# Patient Record
Sex: Female | Born: 1980
Health system: Southern US, Community
[De-identification: ages and names within clinical notes are randomized; demographics above are authoritative.]

## PROBLEM LIST (undated history)

## (undated) DIAGNOSIS — C801 Malignant (primary) neoplasm, unspecified: Secondary | ICD-10-CM

## (undated) DIAGNOSIS — F319 Bipolar disorder, unspecified: Secondary | ICD-10-CM

## (undated) DIAGNOSIS — F419 Anxiety disorder, unspecified: Secondary | ICD-10-CM

## (undated) DIAGNOSIS — F32A Depression, unspecified: Secondary | ICD-10-CM

## (undated) DIAGNOSIS — E559 Vitamin D deficiency, unspecified: Secondary | ICD-10-CM

## (undated) DIAGNOSIS — F329 Major depressive disorder, single episode, unspecified: Secondary | ICD-10-CM

## (undated) DIAGNOSIS — E162 Hypoglycemia, unspecified: Secondary | ICD-10-CM

## (undated) DIAGNOSIS — G8929 Other chronic pain: Secondary | ICD-10-CM

## (undated) DIAGNOSIS — R7989 Other specified abnormal findings of blood chemistry: Secondary | ICD-10-CM

## (undated) DIAGNOSIS — D649 Anemia, unspecified: Secondary | ICD-10-CM

## (undated) HISTORY — DX: Vitamin D deficiency, unspecified: E55.9

## (undated) HISTORY — DX: Headache, unspecified: G89.29

## (undated) HISTORY — DX: Hypoglycemia, unspecified: E16.2

## (undated) HISTORY — DX: Anxiety disorder, unspecified: F41.9

## (undated) HISTORY — PX: TUBAL LIGATION: SHX77

## (undated) HISTORY — DX: Depression, unspecified: F32.A

## (undated) HISTORY — DX: Bipolar disorder, unspecified: F31.9

## (undated) HISTORY — PX: CERVICAL CONE BIOPSY: SUR198

## (undated) HISTORY — PX: DILATION AND CURETTAGE OF UTERUS: SHX78

## (undated) HISTORY — DX: Other specified abnormal findings of blood chemistry: R79.89

---
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
## 2009-01-25 ENCOUNTER — Inpatient Hospital Stay (HOSPITAL_COMMUNITY): Admission: AD | Admit: 2009-01-25 | Discharge: 2009-01-25 | Payer: Self-pay | Admitting: Obstetrics & Gynecology

## 2009-09-09 ENCOUNTER — Ambulatory Visit (HOSPITAL_COMMUNITY): Admission: RE | Admit: 2009-09-09 | Discharge: 2009-09-09 | Payer: Self-pay | Admitting: Obstetrics

## 2009-10-15 ENCOUNTER — Inpatient Hospital Stay (HOSPITAL_COMMUNITY): Admission: AD | Admit: 2009-10-15 | Discharge: 2009-10-15 | Payer: Self-pay | Admitting: Obstetrics

## 2009-11-26 ENCOUNTER — Ambulatory Visit (HOSPITAL_COMMUNITY): Admission: RE | Admit: 2009-11-26 | Discharge: 2009-11-26 | Payer: Self-pay | Admitting: Obstetrics

## 2010-01-18 ENCOUNTER — Inpatient Hospital Stay (HOSPITAL_COMMUNITY): Admission: AD | Admit: 2010-01-18 | Discharge: 2010-01-18 | Payer: Self-pay | Admitting: Obstetrics & Gynecology

## 2010-01-20 ENCOUNTER — Inpatient Hospital Stay (HOSPITAL_COMMUNITY): Admission: RE | Admit: 2010-01-20 | Discharge: 2010-01-23 | Payer: Self-pay | Admitting: Obstetrics

## 2010-03-06 ENCOUNTER — Inpatient Hospital Stay (HOSPITAL_COMMUNITY): Admission: AD | Admit: 2010-03-06 | Discharge: 2010-03-06 | Payer: Self-pay | Admitting: Obstetrics & Gynecology

## 2010-03-10 ENCOUNTER — Ambulatory Visit (HOSPITAL_COMMUNITY): Admission: RE | Admit: 2010-03-10 | Discharge: 2010-03-10 | Payer: Self-pay | Admitting: Obstetrics & Gynecology

## 2010-07-22 ENCOUNTER — Encounter
Admission: RE | Admit: 2010-07-22 | Discharge: 2010-08-12 | Payer: Self-pay | Source: Home / Self Care | Attending: Obstetrics | Admitting: Obstetrics

## 2010-09-05 ENCOUNTER — Encounter: Payer: Self-pay | Admitting: Obstetrics

## 2010-10-30 LAB — CBC
HCT: 37.4 % (ref 36.0–46.0)
MCV: 83.2 fL (ref 78.0–100.0)
Platelets: 217 10*3/uL (ref 150–400)
RBC: 4.49 MIL/uL (ref 3.87–5.11)
RDW: 13.3 % (ref 11.5–15.5)
WBC: 6.1 10*3/uL (ref 4.0–10.5)

## 2010-10-30 LAB — URINALYSIS, ROUTINE W REFLEX MICROSCOPIC
Leukocytes, UA: NEGATIVE
Nitrite: NEGATIVE
Specific Gravity, Urine: >1.03 — ABNORMAL HIGH (ref 1.005–1.030)
Urobilinogen, UA: 0.2 mg/dL (ref 0.0–1.0)

## 2010-11-01 LAB — GLUCOSE, RANDOM: Glucose, Bld: 73 mg/dL (ref 70–99)

## 2010-11-01 LAB — CBC
HCT: 31.6 % — ABNORMAL LOW (ref 36.0–46.0)
Hemoglobin: 10.8 g/dL — ABNORMAL LOW (ref 12.0–15.0)
Hemoglobin: 10.9 g/dL — ABNORMAL LOW (ref 12.0–15.0)
MCHC: 34 g/dL (ref 30.0–36.0)
MCHC: 34.3 g/dL (ref 30.0–36.0)
MCV: 84.8 fL (ref 78.0–100.0)
Platelets: 192 10*3/uL (ref 150–400)
RBC: 3.73 MIL/uL — ABNORMAL LOW (ref 3.87–5.11)
RBC: 3.75 MIL/uL — ABNORMAL LOW (ref 3.87–5.11)
RDW: 14.2 % (ref 11.5–15.5)
WBC: 12.6 10*3/uL — ABNORMAL HIGH (ref 4.0–10.5)
WBC: 7.6 10*3/uL (ref 4.0–10.5)

## 2010-11-22 LAB — URINALYSIS, ROUTINE W REFLEX MICROSCOPIC
Leukocytes, UA: NEGATIVE
Nitrite: NEGATIVE
Specific Gravity, Urine: 1.01 (ref 1.005–1.030)
Urobilinogen, UA: 0.2 mg/dL (ref 0.0–1.0)
pH: 7 (ref 5.0–8.0)

## 2010-11-22 LAB — CBC
MCHC: 33.9 g/dL (ref 30.0–36.0)
Platelets: 210 10*3/uL (ref 150–400)
RDW: 13.2 % (ref 11.5–15.5)

## 2010-11-22 LAB — URINE MICROSCOPIC-ADD ON

## 2010-11-22 LAB — WET PREP, GENITAL

## 2010-11-22 LAB — ABO/RH: ABO/RH(D): O POS

## 2010-11-22 LAB — HCG, QUANTITATIVE, PREGNANCY: hCG, Beta Chain, Quant, S: 3912 m[IU]/mL — ABNORMAL HIGH (ref <5)

## 2010-11-22 LAB — GC/CHLAMYDIA PROBE AMP, GENITAL: Chlamydia, DNA Probe: NEGATIVE

## 2012-02-02 IMAGING — US US OB FOLLOW-UP
1 series · 14 of 28 positions shown · non-contrast
Comparison: none

OBSTETRICAL ULTRASOUND:
 This ultrasound exam was performed in the [HOSPITAL] Ultrasound Department.  The OB US report was generated in the AS system, and faxed to the ordering physician.  This report is also available in [HOSPITAL]?s [employer] and in [REDACTED] PACS.

[Series 1: us ob follow up · 0.27mm/px · 40 acquisitions, 14 frames shown]
[im 2/40]
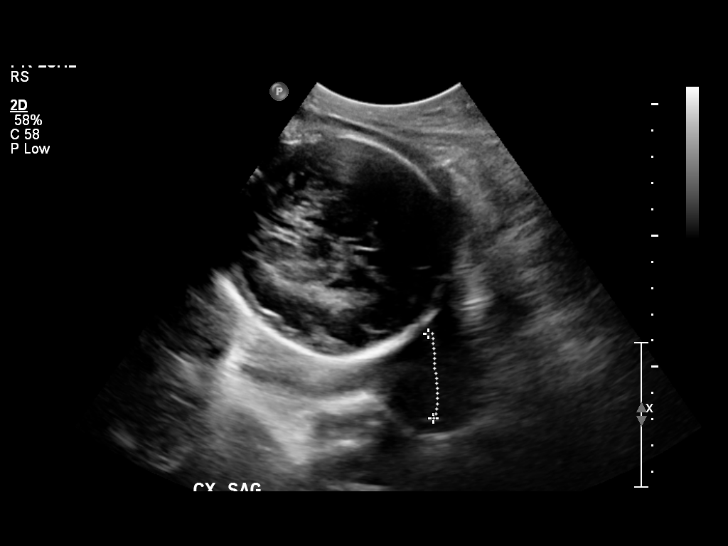
[im 5/40]
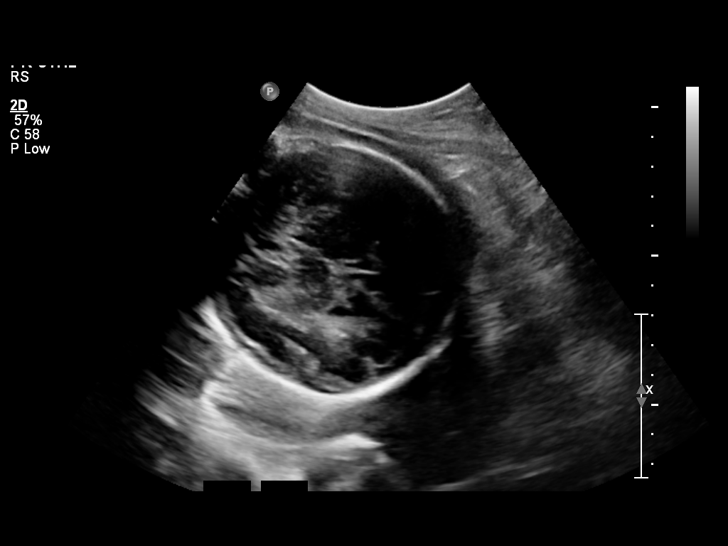
[im 8/40]
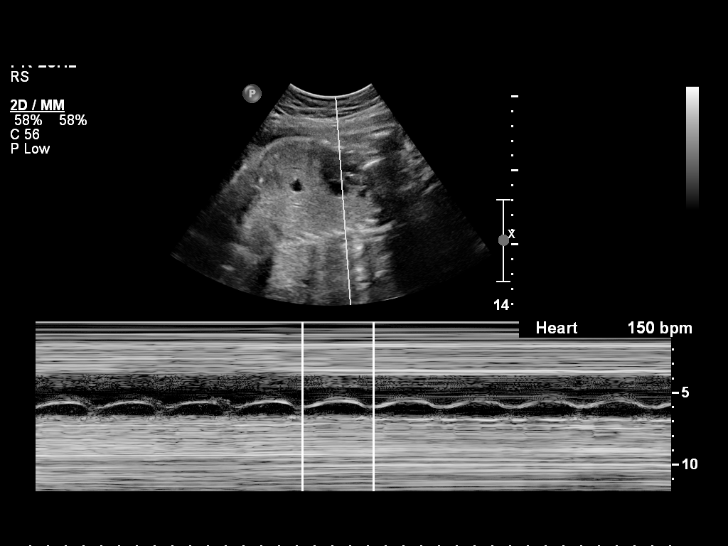
[im 11/40]
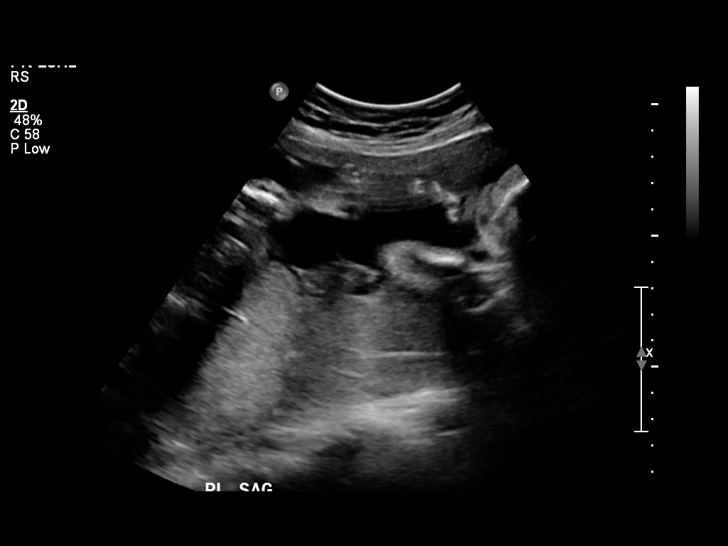
[im 14/40]
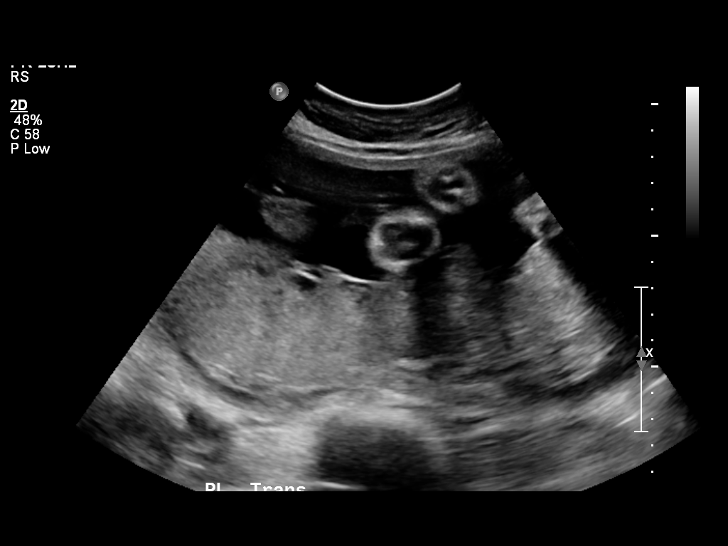
[im 16/40]
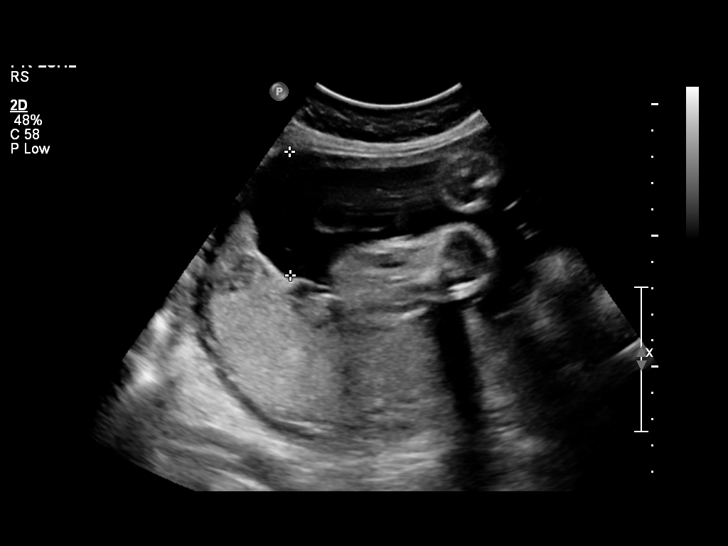
[im 19/40]
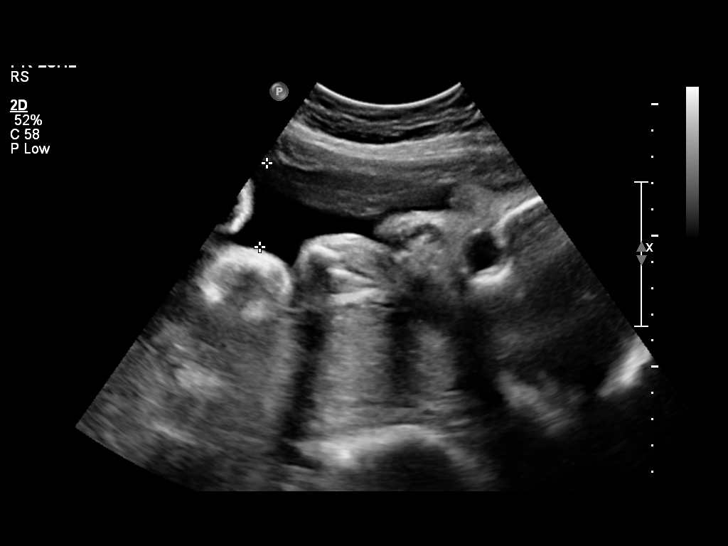
[im 22/40]
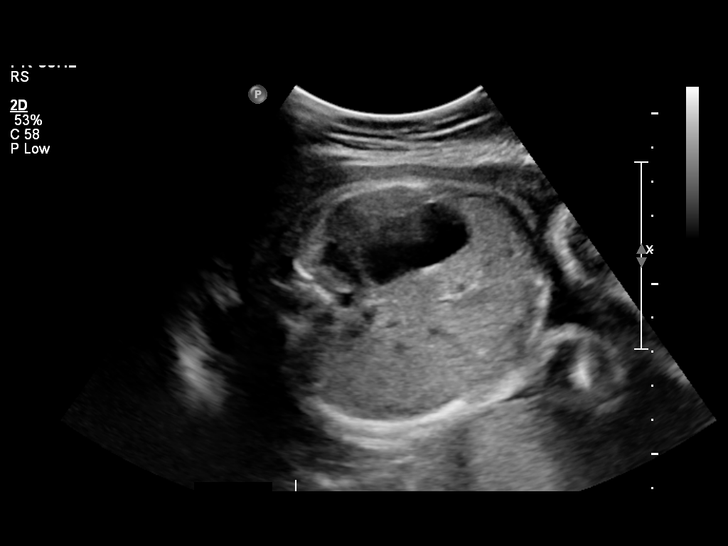
[im 25/40]
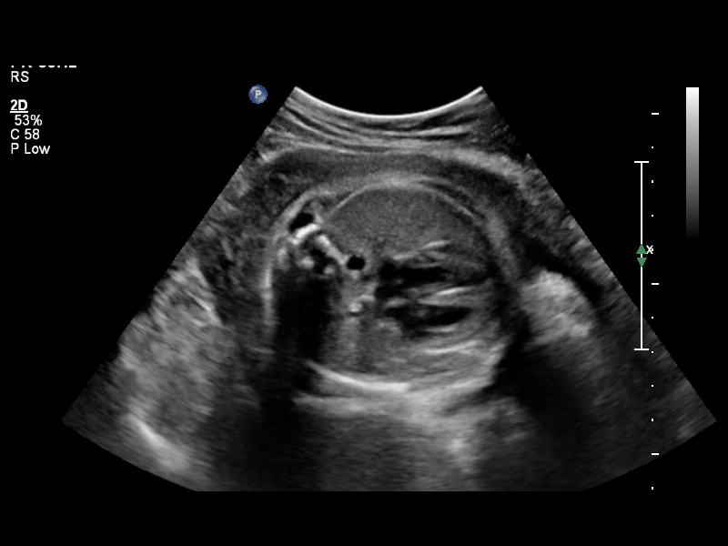
[im 28/40]
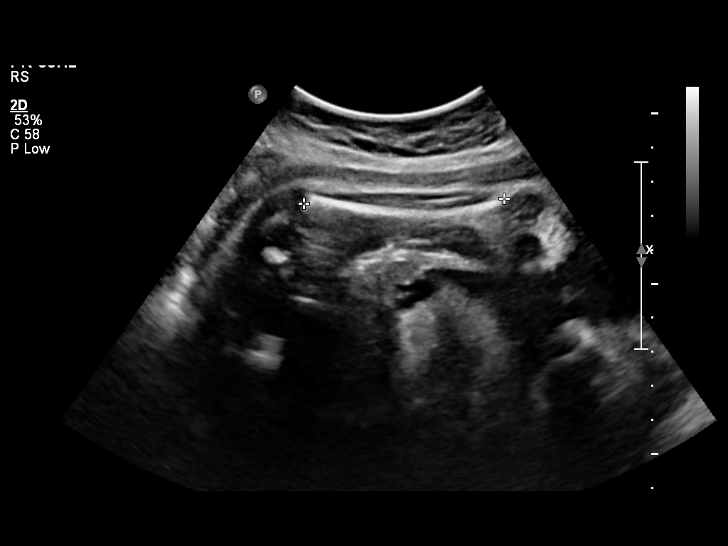
[im 31/40]
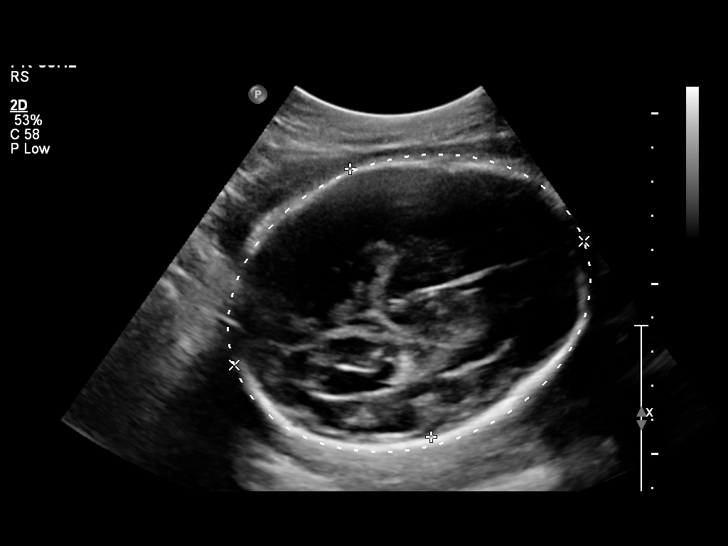
[im 34/40]
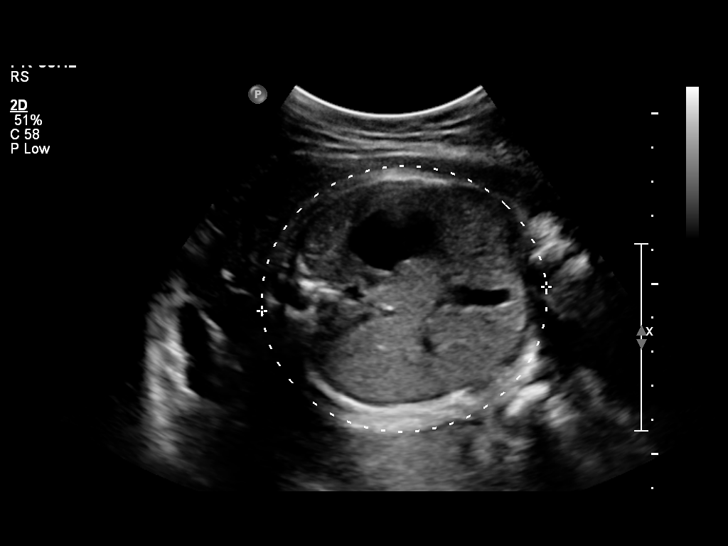
[im 37/40]
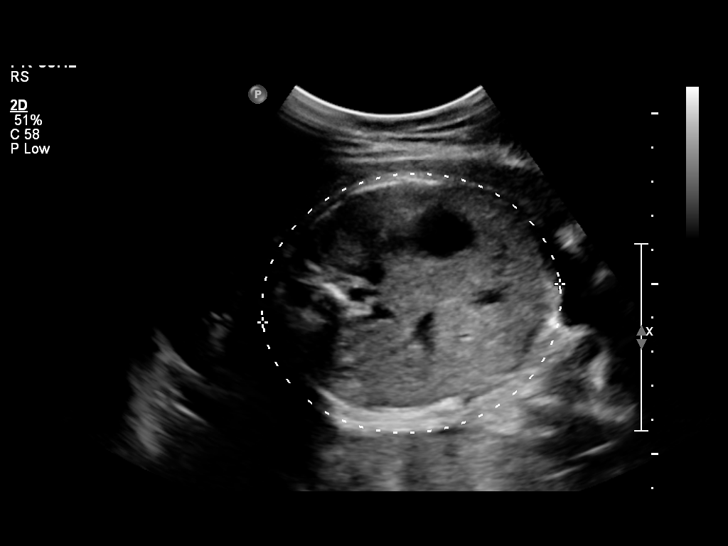
[im 40/40]
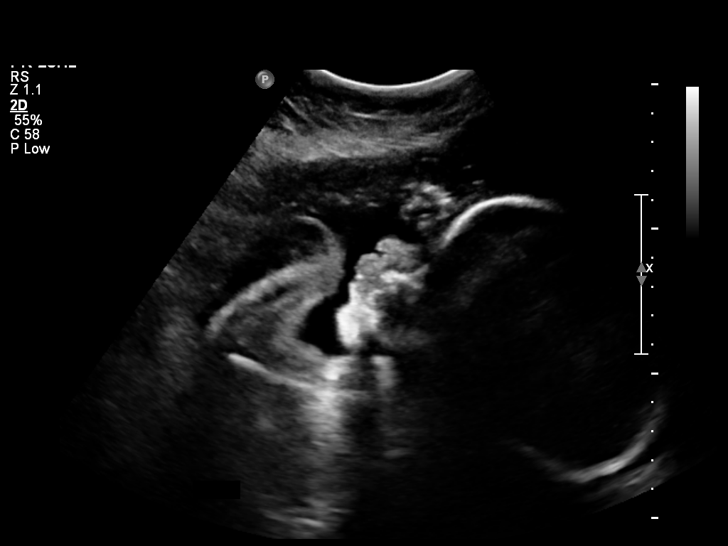

[14 of 28 positions shown; findings below may reference images not displayed]

IMPRESSION: See [facility] Obstetric US report.

## 2012-11-22 ENCOUNTER — Emergency Department (HOSPITAL_COMMUNITY)
Admission: EM | Admit: 2012-11-22 | Discharge: 2012-11-22 | Disposition: A | Discharge status: Home / Self Care | Payer: BC Managed Care – PPO | Source: Home / Self Care

## 2012-11-22 ENCOUNTER — Encounter (HOSPITAL_COMMUNITY): Payer: Self-pay | Admitting: Emergency Medicine

## 2012-11-22 DIAGNOSIS — R51 Headache: Secondary | ICD-10-CM

## 2012-11-22 HISTORY — DX: Anemia, unspecified: D64.9

## 2012-11-22 HISTORY — DX: Malignant (primary) neoplasm, unspecified: C80.1

## 2012-11-22 MED ORDER — KETOROLAC TROMETHAMINE 30 MG/ML IJ SOLN
30.0000 mg | Freq: Once | INTRAMUSCULAR | Status: AC
  Administered 2012-11-22: 30 mg via INTRAVENOUS

## 2012-11-22 MED ORDER — KETOROLAC TROMETHAMINE 30 MG/ML IJ SOLN
INTRAMUSCULAR | Status: AC
  Filled 2012-11-22: qty 1

## 2012-11-22 NOTE — ED Notes (Signed)
Reports: sharp pains on right side of head with spotty vision.  Incident happened around two p.m today.   Pt denies n/v. Pt states that "this has happened before and was able to lay down and sleep it off"  Pt has tried migraine meds, eating, and lying down with no relief of symptoms.

## 2012-11-22 NOTE — ED Notes (Signed)
Pt given injection will discharge at 7 p.m

## 2012-11-22 NOTE — ED Provider Notes (Signed)
Medical screening examination/treatment/procedure(s) were performed by non-physician practitioner and as supervising physician I was immediately available for consultation/collaboration.  Leslee Home, M.D.  Reuben Likes, MD 11/22/12 2211

## 2012-11-22 NOTE — ED Provider Notes (Signed)
History     CSN: [id number]  Arrival date & time 11/22/12  1709   First MD Initiated Contact with Patient 11/22/12 1800      Chief Complaint  Patient presents with  . Headache    right sided head pain that is sharp. having spotty vision. gradually getting worse    (Consider location/radiation/quality/duration/timing/severity/associated sxs/prior treatment) HPI Comments: 32 year old female presents with a headache since 2 PM today. She describes a sharp-type pain in the right posterior lateral scalp. It is localized, no radiation. The pain waxes and wanes. She has had these before and they occur approximately once per month. The only other associated symptom is that she will have flashes or explanations of light in her eyes for just a few seconds. The symptoms usually resolve spontaneously. The difference with this episode is that the pain in the right posterior parietal area remains. She has never been worked up for this type of headache and never diagnosed. She denies neurologic symptoms or focal neurologic problems. Denies problems with vision, speech, hearing, swallowing, paresthesias or focal weakness. She has never had a loss of consciousness or change in cognition, memory or behavior. No nausea, vomiting or abdominal pain. No fever, chills or malaise.   Past Medical History  Diagnosis Date  . Anemia   . Cancer     Past Surgical History  Procedure Laterality Date  . Tubal ligation    . Cervical cone biopsy      History reviewed. No pertinent family history.  History  Substance Use Topics  . Smoking status: Never Smoker   . Smokeless tobacco: Not on file  . Alcohol Use: Yes    OB History   Grav Para Term Preterm Abortions TAB SAB Ect Mult Living                  Review of Systems  Constitutional: Negative.   HENT: Negative for ear pain, congestion, sore throat, mouth sores, trouble swallowing, neck pain and postnasal drip.   Eyes:       Negative other than the  "explosion of light" for a few seconds.  Respiratory: Negative.   Cardiovascular: Negative.   Gastrointestinal: Negative.   Genitourinary: Negative.   Musculoskeletal: Negative.   Skin: Negative.   Neurological: Positive for headaches. Negative for dizziness, tremors, seizures, syncope, facial asymmetry, speech difficulty, weakness and numbness.  Psychiatric/Behavioral: Negative.     Allergies  Review of patient's allergies indicates no known allergies.  Home Medications  No current outpatient prescriptions on file.  LMP 11/14/2012  Physical Exam  Nursing note and vitals reviewed. Constitutional: She is oriented to person, place, and time. She appears well-developed and well-nourished. No distress.  HENT:  Head: Normocephalic and atraumatic.  Right Ear: External ear normal.  Left Ear: External ear normal.  Mouth/Throat: Oropharynx is clear and moist. No oropharyngeal exudate.  Mild tenderness of the right posterior parietal scalp. The area of pain is approximately 4 cm in diameter. No lesions of the scalp are observed.  Eyes: EOM are normal. Pupils are equal, round, and reactive to light.  Neck: Normal range of motion. Neck supple. No thyromegaly present.  Cardiovascular: Normal rate and normal heart sounds.   Pulmonary/Chest: Effort normal and breath sounds normal. No respiratory distress. She has no wheezes. She has no rales.  Abdominal: Soft. There is no tenderness.  Musculoskeletal: Normal range of motion. She exhibits no edema and no tenderness.  Lymphadenopathy:    She has no cervical adenopathy.  Neurological: She  is alert and oriented to person, place, and time. She has normal strength. She displays no atrophy and no tremor. No cranial nerve deficit or sensory deficit. She exhibits normal muscle tone. She displays a negative Romberg sign. She displays no seizure activity. Coordination and gait normal. GCS eye subscore is 4. GCS verbal subscore is 5. GCS motor subscore is  6.  Reflex Scores:      Patellar reflexes are 1+ on the right side and 1+ on the left side. No ataxia, no dysdiadochokinesia. Cranial nerves II through XII grossly intact. Neurology exam is unremarkable.  Skin: Skin is warm and dry.  Psychiatric: She has a normal mood and affect. Her behavior is normal. Thought content normal.    ED Course  Procedures (including critical care time)  Labs Reviewed - No data to display No results found.   1. Headache       MDM  Toradol 30mg  IM now Keep her appointment this month with your PCP. Be sure to mention the pain in the head as he or she may want to refer you to neurology. Should symptoms get worse or are associated with neurologic symptoms we discussed her to the emergency department promptly. Instructions for headac and neuralgia are given.        Hayden Rasmussen, NP 11/22/12 2056

## 2013-01-03 ENCOUNTER — Other Ambulatory Visit: Payer: Self-pay | Admitting: Internal Medicine

## 2013-01-03 DIAGNOSIS — E049 Nontoxic goiter, unspecified: Secondary | ICD-10-CM

## 2013-01-09 ENCOUNTER — Other Ambulatory Visit: Payer: BC Managed Care – PPO

## 2013-03-14 ENCOUNTER — Emergency Department (HOSPITAL_COMMUNITY)
Admission: EM | Admit: 2013-03-14 | Discharge: 2013-03-14 | Disposition: A | Discharge status: Home / Self Care | Payer: BC Managed Care – PPO | Source: Home / Self Care | Attending: Family Medicine | Admitting: Family Medicine

## 2013-03-14 ENCOUNTER — Encounter (HOSPITAL_COMMUNITY): Payer: Self-pay

## 2013-03-14 DIAGNOSIS — D239 Other benign neoplasm of skin, unspecified: Secondary | ICD-10-CM

## 2013-03-14 NOTE — ED Notes (Signed)
Reports she is having a problem w a mole on her face

## 2013-03-14 NOTE — ED Notes (Signed)
Called to Dr Dorita Sciara office, and patient has appt on 8-14 @3 :30 (arrive 15 min early for paperwork) to see Dr Terri Piedra. Detailed written instructions to patient

## 2013-03-14 NOTE — ED Provider Notes (Signed)
  CSN: [id number]     Arrival date & time 03/14/13  1521 History     First MD Initiated Contact with Patient 03/14/13 1554     Chief Complaint  Patient presents with  . Skin Problem   (Consider location/radiation/quality/duration/timing/severity/associated sxs/prior Treatment) Patient is a 32 y.o. female presenting with rash. The history is provided by the patient.  Rash Onset quality:  Unable to specify (mole appeared in 2010, just recently became sore and raised.) Duration:  2 weeks Progression:  Worsening   Past Medical History  Diagnosis Date  . Anemia   . Cancer    Past Surgical History  Procedure Laterality Date  . Tubal ligation    . Cervical cone biopsy     History reviewed. No pertinent family history. History  Substance Use Topics  . Smoking status: Never Smoker   . Smokeless tobacco: Not on file  . Alcohol Use: Yes   OB History   Grav Para Term Preterm Abortions TAB SAB Ect Mult Living                 Review of Systems  Constitutional: Negative.   Skin: Positive for rash.    Allergies  Review of patient's allergies indicates no known allergies.  Home Medications  No current outpatient prescriptions on file. BP 108/88  Pulse 50  Temp(Src) 98.1 F (36.7 C) (Oral)  Resp 16  SpO2 100% Physical Exam  Nursing note and vitals reviewed. Constitutional: She is oriented to person, place, and time. She appears well-developed and well-nourished.  Neurological: She is alert and oriented to person, place, and time.  Skin: Skin is warm and dry.  approx 3mm raised pigmented lesion on left chin area, sl tender.     ED Course   Procedures (including critical care time)  Labs Reviewed - No data to display No results found. 1. Papilloma of skin     MDM    Linna Hoff, MD 03/14/13 1606

## 2014-12-17 ENCOUNTER — Emergency Department (HOSPITAL_COMMUNITY)
Admission: EM | Admit: 2014-12-17 | Discharge: 2014-12-17 | Disposition: A | Discharge status: Home / Self Care | Payer: Managed Care, Other (non HMO) | Attending: Emergency Medicine | Admitting: Emergency Medicine

## 2014-12-17 ENCOUNTER — Encounter (HOSPITAL_COMMUNITY): Payer: Self-pay | Admitting: *Deleted

## 2014-12-17 DIAGNOSIS — Z9851 Tubal ligation status: Secondary | ICD-10-CM | POA: Insufficient documentation

## 2014-12-17 DIAGNOSIS — R1033 Periumbilical pain: Secondary | ICD-10-CM | POA: Diagnosis not present

## 2014-12-17 DIAGNOSIS — R197 Diarrhea, unspecified: Secondary | ICD-10-CM | POA: Insufficient documentation

## 2014-12-17 DIAGNOSIS — Z859 Personal history of malignant neoplasm, unspecified: Secondary | ICD-10-CM | POA: Insufficient documentation

## 2014-12-17 DIAGNOSIS — Z3202 Encounter for pregnancy test, result negative: Secondary | ICD-10-CM | POA: Insufficient documentation

## 2014-12-17 DIAGNOSIS — Z862 Personal history of diseases of the blood and blood-forming organs and certain disorders involving the immune mechanism: Secondary | ICD-10-CM | POA: Diagnosis not present

## 2014-12-17 DIAGNOSIS — R1013 Epigastric pain: Secondary | ICD-10-CM | POA: Insufficient documentation

## 2014-12-17 DIAGNOSIS — R112 Nausea with vomiting, unspecified: Secondary | ICD-10-CM | POA: Insufficient documentation

## 2014-12-17 DIAGNOSIS — R109 Unspecified abdominal pain: Secondary | ICD-10-CM

## 2014-12-17 LAB — COMPREHENSIVE METABOLIC PANEL
ALT: 13 U/L — ABNORMAL LOW (ref 14–54)
AST: 20 U/L (ref 15–41)
Albumin: 4 g/dL (ref 3.5–5.0)
Alkaline Phosphatase: 54 U/L (ref 38–126)
Anion gap: 11 (ref 5–15)
BUN: 8 mg/dL (ref 6–20)
CO2: 20 mmol/L — ABNORMAL LOW (ref 22–32)
Calcium: 9.1 mg/dL (ref 8.9–10.3)
Chloride: 106 mmol/L (ref 101–111)
Creatinine, Ser: 0.66 mg/dL (ref 0.44–1.00)
GFR calc Af Amer: >60 mL/min (ref >60)
GFR calc non Af Amer: >60 mL/min (ref >60)
Glucose, Bld: 93 mg/dL (ref 70–99)
Potassium: 3.5 mmol/L (ref 3.5–5.1)
Sodium: 137 mmol/L (ref 135–145)
Total Bilirubin: 1.1 mg/dL (ref 0.3–1.2)
Total Protein: 7.6 g/dL (ref 6.5–8.1)

## 2014-12-17 LAB — CBC WITH DIFFERENTIAL/PLATELET
BASOS ABS: 0 10*3/uL (ref 0.0–0.1)
BASOS PCT: 0 % (ref 0–1)
EOS PCT: 1 % (ref 0–5)
Eosinophils Absolute: 0.1 10*3/uL (ref 0.0–0.7)
HCT: 37.7 % (ref 36.0–46.0)
Hemoglobin: 12.5 g/dL (ref 12.0–15.0)
LYMPHS ABS: 1.3 10*3/uL (ref 0.7–4.0)
LYMPHS PCT: 10 % — AB (ref 12–46)
MCH: 26.9 pg (ref 26.0–34.0)
MCHC: 33.2 g/dL (ref 30.0–36.0)
MCV: 81.1 fL (ref 78.0–100.0)
MONO ABS: 0.8 10*3/uL (ref 0.1–1.0)
MONOS PCT: 6 % (ref 3–12)
NEUTROS PCT: 83 % — AB (ref 43–77)
Neutro Abs: 11.1 10*3/uL — ABNORMAL HIGH (ref 1.7–7.7)
PLATELETS: 260 10*3/uL (ref 150–400)
RBC: 4.65 MIL/uL (ref 3.87–5.11)
RDW: 13.4 % (ref 11.5–15.5)
WBC: 13.3 10*3/uL — AB (ref 4.0–10.5)

## 2014-12-17 LAB — POC URINE PREG, ED: Preg Test, Ur: NEGATIVE

## 2014-12-17 LAB — LIPASE, BLOOD: Lipase: 20 U/L — ABNORMAL LOW (ref 22–51)

## 2014-12-17 MED ORDER — PROMETHAZINE HCL 25 MG PO TABS: 25.0000 mg | ORAL_TABLET | Freq: Four times a day (QID) | ORAL | Status: DC | PRN

## 2014-12-17 MED ORDER — ACETAMINOPHEN 500 MG PO TABS
1000.0000 mg | ORAL_TABLET | Freq: Once | ORAL | Status: AC
  Administered 2014-12-17: 1000 mg via ORAL
  Filled 2014-12-17: qty 2

## 2014-12-17 MED ORDER — SODIUM CHLORIDE 0.9 % IV BOLUS (SEPSIS)
1000.0000 mL | Freq: Once | INTRAVENOUS | Status: AC
  Administered 2014-12-17: 1000 mL via INTRAVENOUS

## 2014-12-17 MED ORDER — ONDANSETRON 4 MG PO TBDP
8.0000 mg | ORAL_TABLET | Freq: Once | ORAL | Status: AC
  Administered 2014-12-17: 8 mg via ORAL
  Filled 2014-12-17: qty 2

## 2014-12-17 MED ORDER — MORPHINE SULFATE 4 MG/ML IJ SOLN
4.0000 mg | Freq: Once | INTRAMUSCULAR | Status: AC
  Administered 2014-12-17: 4 mg via INTRAVENOUS
  Filled 2014-12-17: qty 1

## 2014-12-17 NOTE — ED Notes (Signed)
She thinks she has some food poisioning.  She ate a salad yesterday and onr hour after she ate it she started having nv and diarrhea and abd cramps.  Her symptoms continue.  .  lmp  Last month

## 2014-12-17 NOTE — ED Provider Notes (Signed)
CSN: [id number]     Arrival date & time 12/17/14  1500 History  This chart was scribed for non-physician practitioner working with Milton Ferguson, MD by Molli Posey, ED Scribe. This patient was seen in room TR03C/TR03C and the patient's care was started at 4:10 PM.   Chief Complaint  Patient presents with  . Emesis   The history is provided by the patient. No language interpreter was used.   HPI Comments: Hannah Dominguez is a 34 y.o. female with a history of anemia and CA who presents to the Emergency Department complaining of vomiting that started yesterday around 8PM. Pt states that she thinks she had food poisoning and says she ate lunch yesterday at [employer], then started experiencing abdominal pain around 7PM, and then had vomiting and diarrhea around 8PM. She states that her last episode of diarrhea was 11AM today. She reports she had approximately 7 episodes of vomiting and 10-15 episodes of diarrhea yesterday. Denies blood or mucous in diarrhea, denies blood or bile in emesis  Pt complains of waxing and waning upper abdominal pain at this time and describes her pain as cramping, 10/10 at worst.  Pt repots a past surgical history of tubal ligation. She denies recent travel or any known sick contacts. She reports NKDA. She states she does not have a PCP. Pt denies back pain or pain with urination.   Past Medical History  Diagnosis Date  . Anemia   . Cancer    Past Surgical History  Procedure Laterality Date  . Tubal ligation    . Cervical cone biopsy     No family history on file. History  Substance Use Topics  . Smoking status: Never Smoker   . Smokeless tobacco: Not on file  . Alcohol Use: Yes   OB History    No data available     Review of Systems  Gastrointestinal: Positive for nausea, vomiting, abdominal pain and diarrhea.  Genitourinary: Negative for dysuria.  Musculoskeletal: Negative for back pain.  All other systems reviewed and are negative.  Allergies  Review of  patient's allergies indicates no known allergies.  Home Medications   Prior to Admission medications   Medication Sig Start Date End Date Taking? Authorizing Provider  acetaminophen (TYLENOL) 500 MG tablet Take 500 mg by mouth every 6 (six) hours as needed for mild pain or moderate pain.   Yes Historical Provider, MD  bismuth subsalicylate (PEPTO BISMOL) 262 MG/15ML suspension Take 30 mLs by mouth every 6 (six) hours as needed for indigestion.   Yes Historical Provider, MD  promethazine (PHENERGAN) 25 MG tablet Take 1 tablet (25 mg total) by mouth every 6 (six) hours as needed for nausea or vomiting. 12/17/14   Noland Fordyce, PA-C   BP 101/60 mmHg  Pulse 98  Temp(Src) 101.8 F (38.8 C) (Oral)  Resp 20  Ht 5\' 6"  (1.676 m)  Wt 134 lb (60.782 kg)  BMI 21.64 kg/m2  SpO2 100%  LMP 11/17/2014 Physical Exam  Constitutional: She is oriented to person, place, and time. She appears well-developed and well-nourished.  HENT:  Head: Normocephalic and atraumatic.  Mouth/Throat: Oropharynx is clear and moist.  Eyes: Right eye exhibits no discharge. Left eye exhibits no discharge.  Neck: Neck supple. No tracheal deviation present.  Cardiovascular: Normal rate, regular rhythm and normal heart sounds.   Pulmonary/Chest: Effort normal. No respiratory distress.  Abdominal: Soft. She exhibits no distension. There is tenderness. There is no rebound and no guarding.  Tenderness to umbilical and  epigastric regions without rebound or guarding. No CVA tenderness.   Neurological: She is alert and oriented to person, place, and time.  Skin: Skin is warm and dry.  Psychiatric: She has a normal mood and affect. Her behavior is normal.  Nursing note and vitals reviewed.   ED Course  Procedures  DIAGNOSTIC STUDIES: Oxygen Saturation is 100% on RA, normal by my interpretation.    COORDINATION OF CARE: 4:15 PM Discussed treatment plan with pt at bedside and pt agreed to plan.   Labs Review Labs Reviewed   CBC WITH DIFFERENTIAL/PLATELET - Abnormal; Notable for the following:    WBC 13.3 (*)    Neutrophils Relative % 83 (*)    Neutro Abs 11.1 (*)    Lymphocytes Relative 10 (*)    All other components within normal limits  COMPREHENSIVE METABOLIC PANEL - Abnormal; Notable for the following:    CO2 20 (*)    ALT 13 (*)    All other components within normal limits  LIPASE, BLOOD - Abnormal; Notable for the following:    Lipase 20 (*)    All other components within normal limits  POC URINE PREG, ED    Imaging Review No results found.   EKG Interpretation None      MDM   Final diagnoses:  Nausea vomiting and diarrhea  Abdominal cramping   Pt presenting to ED with concern for possible food poisoning with associated abdominal pain, n/v/d that started yesterday.  N/v/d is better today but abdominal cramping persists.  No urinary or vaginal symptoms.  Pt is tender to epigastrium and umbilicus w/o rebound or guarding. Abdomen is soft. Labs: mild elevated WBC at 13.3, otherwise unremarkable. Not concerned for cholecystitis or pancreatitis. Non-tender in RLQ or pelvis. Low concern for appendicitis. Not concerned for ovarian torsion or ovarian abscess.   Pt given IV fluids and morphine in ED. Pain improved from 10/10 to 2/10.  Pt has been able to keep down PO fluids and crackers. Pt also given acetaminophen in ED as temp rose to 101.8 from 99.3 since initial triage.  Symptoms more likely viral in nature given temp.  Will discharge home with phenergan and strict return precautions. Home care instructions provided. Pt verbalized understanding and agreement with tx plan.   I personally performed the services described in this documentation, which was scribed in my presence. The recorded information has been reviewed and is accurate.     Noland Fordyce, PA-C 12/17/14 1851  Milton Ferguson, MD 12/18/14 [phone]

## 2014-12-17 NOTE — ED Notes (Signed)
I gave the patient a cup of ice, a container of apple juice and a pack of graham crackers.

## 2014-12-17 NOTE — Discharge Instructions (Signed)
Abdominal Pain, Women °Abdominal (stomach, pelvic, or belly) pain can be caused by many things. It is important to tell your doctor: °· The location of the pain. °· Does it come and go or is it present all the time? °· Are there things that start the pain (eating certain foods, exercise)? °· Are there other symptoms associated with the pain (fever, nausea, vomiting, diarrhea)? °All of this is helpful to know when trying to find the cause of the pain. °CAUSES  °· Stomach: virus or bacteria infection, or ulcer. °· Intestine: appendicitis (inflamed appendix), regional ileitis (Crohn's disease), ulcerative colitis (inflamed colon), irritable bowel syndrome, diverticulitis (inflamed diverticulum of the colon), or cancer of the stomach or intestine. °· Gallbladder disease or stones in the gallbladder. °· Kidney disease, kidney stones, or infection. °· Pancreas infection or cancer. °· Fibromyalgia (pain disorder). °· Diseases of the female organs: °¨ Uterus: fibroid (non-cancerous) tumors or infection. °¨ Fallopian tubes: infection or tubal pregnancy. °¨ Ovary: cysts or tumors. °¨ Pelvic adhesions (scar tissue). °¨ Endometriosis (uterus lining tissue growing in the pelvis and on the pelvic organs). °¨ Pelvic congestion syndrome (female organs filling up with blood just before the menstrual period). °¨ Pain with the menstrual period. °¨ Pain with ovulation (producing an egg). °¨ Pain with an IUD (intrauterine device, birth control) in the uterus. °¨ Cancer of the female organs. °· Functional pain (pain not caused by a disease, may improve without treatment). °· Psychological pain. °· Depression. °DIAGNOSIS  °Your doctor will decide the seriousness of your pain by doing an examination. °· Blood tests. °· X-rays. °· Ultrasound. °· CT scan (computed tomography, special type of X-ray). °· MRI (magnetic resonance imaging). °· Cultures, for infection. °· Barium enema (dye inserted in the large intestine, to better view it with  X-rays). °· Colonoscopy (looking in intestine with a lighted tube). °· Laparoscopy (minor surgery, looking in abdomen with a lighted tube). °· Major abdominal exploratory surgery (looking in abdomen with a large incision). °TREATMENT  °The treatment will depend on the cause of the pain.  °· Many cases can be observed and treated at home. °· Over-the-counter medicines recommended by your caregiver. °· Prescription medicine. °· Antibiotics, for infection. °· Birth control pills, for painful periods or for ovulation pain. °· Hormone treatment, for endometriosis. °· Nerve blocking injections. °· Physical therapy. °· Antidepressants. °· Counseling with a psychologist or psychiatrist. °· Minor or major surgery. °HOME CARE INSTRUCTIONS  °· Do not take laxatives, unless directed by your caregiver. °· Take over-the-counter pain medicine only if ordered by your caregiver. Do not take aspirin because it can cause an upset stomach or bleeding. °· Try a clear liquid diet (broth or water) as ordered by your caregiver. Slowly move to a bland diet, as tolerated, if the pain is related to the stomach or intestine. °· Have a thermometer and take your temperature several times a day, and record it. °· Bed rest and sleep, if it helps the pain. °· Avoid sexual intercourse, if it causes pain. °· Avoid stressful situations. °· Keep your follow-up appointments and tests, as your caregiver orders. °· If the pain does not go away with medicine or surgery, you may try: °¨ Acupuncture. °¨ Relaxation exercises (yoga, meditation). °¨ Group therapy. °¨ Counseling. °SEEK MEDICAL CARE IF:  °· You notice certain foods cause stomach pain. °· Your home care treatment is not helping your pain. °· You need stronger pain medicine. °· You want your IUD removed. °· You feel faint or   lightheaded. °· You develop nausea and vomiting. °· You develop a rash. °· You are having side effects or an allergy to your medicine. °SEEK IMMEDIATE MEDICAL CARE IF:  °· Your  pain does not go away or gets worse. °· You have a fever. °· Your pain is felt only in portions of the abdomen. The right side could possibly be appendicitis. The left lower portion of the abdomen could be colitis or diverticulitis. °· You are passing blood in your stools (bright red or black tarry stools, with or without vomiting). °· You have blood in your urine. °· You develop chills, with or without a fever. °· You pass out. °MAKE SURE YOU:  °· Understand these instructions. °· Will watch your condition. °· Will get help right away if you are not doing well or get worse. °Document Released: 05/29/2007 Document Revised: 12/16/2013 Document Reviewed: 06/18/2009 °[employer]® Patient Information ©2015 [employer]. This information is not intended to replace advice given to you by your health care provider. Make sure you discuss any questions you have with your health care provider. ° °

## 2014-12-17 NOTE — ED Notes (Signed)
Pt ate yesterday at about 3pm. At 4pm had abdominal cramping, nausea vomiting and diarrhea. Last episode of vomiting was at 3am. Last diarrhea was 11am. No vomiting or diarrhea since then, just complaining of abdominal cramping that comes and goes. Pt took pepto at 10am.

## 2015-09-15 ENCOUNTER — Emergency Department (HOSPITAL_COMMUNITY): Payer: 59

## 2015-09-15 ENCOUNTER — Emergency Department (HOSPITAL_COMMUNITY)
Admission: EM | Admit: 2015-09-15 | Discharge: 2015-09-15 | Disposition: A | Discharge status: Home / Self Care | Payer: 59 | Attending: Emergency Medicine | Admitting: Emergency Medicine

## 2015-09-15 ENCOUNTER — Encounter (HOSPITAL_COMMUNITY): Payer: Self-pay | Admitting: Emergency Medicine

## 2015-09-15 DIAGNOSIS — Z859 Personal history of malignant neoplasm, unspecified: Secondary | ICD-10-CM | POA: Diagnosis not present

## 2015-09-15 DIAGNOSIS — Z79899 Other long term (current) drug therapy: Secondary | ICD-10-CM | POA: Insufficient documentation

## 2015-09-15 DIAGNOSIS — R51 Headache: Secondary | ICD-10-CM | POA: Insufficient documentation

## 2015-09-15 DIAGNOSIS — Z7982 Long term (current) use of aspirin: Secondary | ICD-10-CM | POA: Diagnosis not present

## 2015-09-15 DIAGNOSIS — R519 Headache, unspecified: Secondary | ICD-10-CM

## 2015-09-15 DIAGNOSIS — H538 Other visual disturbances: Secondary | ICD-10-CM | POA: Insufficient documentation

## 2015-09-15 DIAGNOSIS — Z862 Personal history of diseases of the blood and blood-forming organs and certain disorders involving the immune mechanism: Secondary | ICD-10-CM | POA: Insufficient documentation

## 2015-09-15 DIAGNOSIS — G8929 Other chronic pain: Secondary | ICD-10-CM | POA: Insufficient documentation

## 2015-09-15 MED ORDER — OXYCODONE-ACETAMINOPHEN 5-325 MG PO TABS
2.0000 | ORAL_TABLET | Freq: Once | ORAL | Status: AC
  Administered 2015-09-15: 2 via ORAL
  Filled 2015-09-15: qty 2

## 2015-09-15 MED ORDER — DIPHENHYDRAMINE HCL 50 MG/ML IJ SOLN: 25.0000 mg | Freq: Once | INTRAMUSCULAR | Status: DC

## 2015-09-15 MED ORDER — METOCLOPRAMIDE HCL 5 MG/ML IJ SOLN
10.0000 mg | Freq: Once | INTRAMUSCULAR | Status: AC
  Administered 2015-09-15: 10 mg via INTRAVENOUS
  Filled 2015-09-15: qty 2

## 2015-09-15 MED ORDER — KETOROLAC TROMETHAMINE 15 MG/ML IJ SOLN
15.0000 mg | Freq: Once | INTRAMUSCULAR | Status: AC
  Administered 2015-09-15: 15 mg via INTRAVENOUS
  Filled 2015-09-15: qty 1

## 2015-09-15 MED ORDER — DEXAMETHASONE SODIUM PHOSPHATE 10 MG/ML IJ SOLN: 10.0000 mg | Freq: Once | INTRAMUSCULAR | Status: DC

## 2015-09-15 MED ORDER — SODIUM CHLORIDE 0.9 % IV BOLUS (SEPSIS): 1000.0000 mL | Freq: Once | INTRAVENOUS | Status: DC

## 2015-09-15 NOTE — ED Provider Notes (Signed)
Patient having intermittent headaches for past May year. Headaches typically occipital and move side-to-side, sharp in nature. No other associated symptoms no photophobia. She typically treats himself with bare aspirin however today headache is not resolved. Headache is 6-10 at present. Headaches have become worse since starting Wellbutrin a few weeks ago, prescribed to her by her psychiatrist for bipolar disorder and depression. On exam alert no distress GCS score 15 cranial nerves II through XII grossly intact. Motor strength 5 over 5 overall finger-nose normal DTRs symmetric bilaterally and knee jerk and ankle jerk and biceps toes downward going bilaterally  Orlie Dakin, MD 09/16/15 [id number]

## 2015-09-15 NOTE — ED Notes (Signed)
Pt st's headache has subsided at this time

## 2015-09-15 NOTE — ED Provider Notes (Signed)
CSN: [id number]     Arrival date & time 09/15/15  1215 History   None    Chief Complaint  Patient presents with  . Headache     (Consider location/radiation/quality/duration/timing/severity/associated sxs/prior Treatment) Patient is a 35 y.o. female presenting with headaches. The history is provided by the patient.  Headache Pain location:  L parietal and R parietal Quality:  Sharp and stabbing Radiates to: around head. Severity currently:  6/10 Severity at highest:  9/10 Onset quality:  Gradual Duration: intermittent for 1 year but worsening over 1 week. Timing:  Intermittent Progression:  Waxing and waning Chronicity:  Chronic Context: not activity and not loud noise   Relieved by: usually relieved by asa, but not currently. Worsened by:  Nothing Ineffective treatments:  Aspirin Associated symptoms: blurred vision (when the headache is at it's max)   Associated symptoms: no abdominal pain, no congestion, no cough, no diarrhea, no fever, no focal weakness, no loss of balance, no nausea, no neck pain, no neck stiffness, no seizures and no syncope     Past Medical History  Diagnosis Date  . Anemia   . Cancer [facility])    Past Surgical History  Procedure Laterality Date  . Tubal ligation    . Cervical cone biopsy     History reviewed. No pertinent family history. Social History  Substance Use Topics  . Smoking status: Never Smoker   . Smokeless tobacco: None  . Alcohol Use: Yes   OB History    No data available     Review of Systems  Constitutional: Negative for fever and activity change.  HENT: Negative for congestion.   Eyes: Positive for blurred vision (when the headache is at it's max). Negative for redness.  Respiratory: Negative for cough, shortness of breath and wheezing.   Cardiovascular: Negative for chest pain and syncope.  Gastrointestinal: Negative for nausea, abdominal pain and diarrhea.  Genitourinary: Negative.   Musculoskeletal: Negative for neck  pain and neck stiffness.  Skin: Negative for pallor, rash and wound.  Neurological: Positive for headaches. Negative for focal weakness, seizures, syncope, facial asymmetry and loss of balance.      Allergies  Review of patient's allergies indicates no known allergies.  Home Medications   Prior to Admission medications   Medication Sig Start Date End Date Taking? Authorizing Provider  aspirin 325 MG tablet Take 325 mg by mouth every 6 (six) hours as needed for mild pain, moderate pain or headache.   Yes Historical Provider, MD  buPROPion (WELLBUTRIN XL) 150 MG 24 hr tablet Take 150 mg by mouth daily.   Yes Historical Provider, MD  lamoTRIgine (LAMICTAL) 100 MG tablet Take 100 mg by mouth daily.   Yes Historical Provider, MD  risperiDONE (RISPERDAL) 1 MG tablet Take 1 mg by mouth daily.   Yes Historical Provider, MD  traZODone (DESYREL) 50 MG tablet Take 50-100 mg by mouth at bedtime as needed for sleep.   Yes Historical Provider, MD  promethazine (PHENERGAN) 25 MG tablet Take 1 tablet (25 mg total) by mouth every 6 (six) hours as needed for nausea or vomiting. Patient not taking: Reported on 09/15/2015 12/17/14   Noland Fordyce, PA-C   BP 104/71 mmHg  Pulse 72  Temp(Src) 98.8 F (37.1 C) (Oral)  Resp 17  SpO2 97%  LMP 09/14/2015 Physical Exam  Constitutional: She appears well-developed and well-nourished. No distress.  HENT:  Head: Normocephalic and atraumatic.  Mouth/Throat: No oropharyngeal exudate.  Eyes: Pupils are equal, round, and reactive to  light. No scleral icterus.  Neck: Normal range of motion. Neck supple.  Cardiovascular: Normal rate, regular rhythm, normal heart sounds and intact distal pulses.   Pulmonary/Chest: Effort normal. No respiratory distress. She has no wheezes. She has no rales.  Abdominal: She exhibits no distension. There is no tenderness. There is no rebound.  Musculoskeletal: Normal range of motion. She exhibits no edema or tenderness.  Neurological:  She is alert. She has normal strength. She displays no tremor. No cranial nerve deficit or sensory deficit. She exhibits normal muscle tone. She displays a negative Romberg sign. Coordination and gait normal. GCS eye subscore is 4. GCS verbal subscore is 5. GCS motor subscore is 6.  Skin: Skin is warm and dry. No rash noted. She is not diaphoretic. No erythema. No pallor.  Psychiatric: She has a normal mood and affect.  Nursing note and vitals reviewed.   ED Course  Procedures (including critical care time) Labs Review Labs Reviewed - No data to display  Imaging Review Ct Head Wo Contrast  09/15/2015  CLINICAL DATA:  Headache with pressure behind the eyes for 1 week. EXAM: CT HEAD WITHOUT CONTRAST TECHNIQUE: Contiguous axial images were obtained from the base of the skull through the vertex without intravenous contrast. COMPARISON:  None FINDINGS: Sinuses/Soft tissues: Small mucous retention cysts or polyps in left ethmoid air cells and right sphenoid sinus. Other paranasal sinuses and mastoid air cells are clear. Intracranial: No mass lesion, hemorrhage, hydrocephalus, acute infarct, intra-axial, or extra-axial fluid collection. IMPRESSION: No acute intracranial abnormality. Electronically Signed   By: Abigail Miyamoto M.D.   On: 09/15/2015 16:48   I have personally reviewed and evaluated these images and lab results as part of my medical decision-making.   EKG Interpretation None      MDM   Final diagnoses:  Chronic nonintractable headache, unspecified headache type    Patient is a 35 year old female who presents with an acute on chronic headache that has worsened over the past 1 week. She has had intermittent headaches for the past year. This one is different in the sense that it has not been relieved with her usual aspirin dose. Further history and exam as above notable for stable vital signs and intact neurologic exam. CT head obtained without acute intracranial abnormality. Symptoms  resolved with treatment here. Patient states that she has been trying to get established with a PCP and has not been able to get a appointment set.  I have reviewed all imaging. Patient stable for discharge home.  I have reviewed all results with the patient. Advised to f/u with neurology for further headache management and establish PCP. Patient agrees to stated plan. All questions answered. Advised to call or return to have any questions, new symptoms, change in symptoms, or symptoms that they do not understand.     Heriberto Antigua, MD 09/16/15 [id number]  Orlie Dakin, MD 09/16/15 [id number]

## 2015-09-15 NOTE — ED Notes (Signed)
Pt st's no relief from meds.  St's headache is worse now.  Pt alert and oriented x's 3

## 2015-09-15 NOTE — Discharge Instructions (Signed)

## 2015-09-15 NOTE — ED Notes (Signed)
Pt sts HA to back of head and sides of head with some pressure behind her eyes x 1 week

## 2017-01-04 ENCOUNTER — Ambulatory Visit: Payer: 59 | Admitting: Podiatry

## 2017-02-13 ENCOUNTER — Ambulatory Visit (INDEPENDENT_AMBULATORY_CARE_PROVIDER_SITE_OTHER): Payer: 59 | Admitting: Podiatry

## 2017-02-13 ENCOUNTER — Ambulatory Visit (INDEPENDENT_AMBULATORY_CARE_PROVIDER_SITE_OTHER): Payer: 59

## 2017-02-13 DIAGNOSIS — M722 Plantar fascial fibromatosis: Secondary | ICD-10-CM

## 2017-02-13 DIAGNOSIS — M79673 Pain in unspecified foot: Secondary | ICD-10-CM

## 2017-02-13 MED ORDER — MELOXICAM 15 MG PO TABS: 15.0000 mg | ORAL_TABLET | Freq: Every day | ORAL | 1 refills | Status: DC

## 2017-02-15 NOTE — Progress Notes (Signed)
   Subjective: Patient presents today for sharp, aching pain and tenderness in the feet bilaterally that began approximately 7 months ago. Patient states that it hurts in the mornings with the first steps out of bed. She states being on her feet for long periods of time and wearing shoes increases the pain. Patient presents today for further treatment and evaluation  Objective: Physical Exam General: The patient is alert and oriented x3 in no acute distress.  Dermatology: Skin is warm, dry and supple bilateral lower extremities. Negative for open lesions or macerations bilateral.   Vascular: Dorsalis Pedis and Posterior Tibial pulses palpable bilateral.  Capillary fill time is immediate to all digits.  Neurological: Epicritic and protective threshold intact bilateral.   Musculoskeletal: Tenderness to palpation at the medial calcaneal tubercale and through the insertion of the plantar fascia of the bilateral feet. All other joints range of motion within normal limits bilateral. Strength 5/5 in all groups bilateral.   Radiographic exam: Normal osseous mineralization. Joint spaces preserved. No fracture/dislocation/boney destruction. Calcaneal spur present with mild thickening of plantar fascia bilateral. No other soft tissue abnormalities or radiopaque foreign bodies.   Assessment: #1 plantar fasciitis bilateral feet #2 pain in bilateral feet  Plan of Care:  1. Patient evaluated. Xrays reviewed.   2. Injection of 0.5cc Celestone soluspan injected into the bilateral heels.  3. Instructed patient regarding therapies and modalities at home to alleviate symptoms.  4. Rx for Meloxicam 15mg  PO given to patient.  5. Recommended Superfeet insoles. 6. Return to clinic in 4 weeks.   Edrick Kins, DPM [facility]  Dr. Edrick Kins, [address]                                        [place], [place] [address]                Office [phone]  Fax [phone]

## 2017-02-18 MED ORDER — BETAMETHASONE SOD PHOS & ACET 6 (3-3) MG/ML IJ SUSP: 3.0000 mg | Freq: Once | INTRAMUSCULAR | Status: AC

## 2017-03-13 ENCOUNTER — Ambulatory Visit (INDEPENDENT_AMBULATORY_CARE_PROVIDER_SITE_OTHER): Payer: 59 | Admitting: Podiatry

## 2017-03-13 ENCOUNTER — Encounter: Payer: Self-pay | Admitting: Podiatry

## 2017-03-13 DIAGNOSIS — M722 Plantar fascial fibromatosis: Secondary | ICD-10-CM

## 2017-03-13 MED ORDER — METHYLPREDNISOLONE 4 MG PO TBPK: ORAL_TABLET | ORAL | 0 refills | Status: DC

## 2017-03-13 NOTE — Progress Notes (Signed)
   Subjective: 36 year old female presents today for follow-up evaluation of plantar fasciitis of bilateral feet. Patient believes the heel pain is worse. She believes that the injections and the meloxicam increased her symptoms. The patient was unable to get over-the-counter super feet insoles. She is going to get them this upcoming month. Patient believes that her pain is increased and she can now walk less distance without aggravating the injury.  Objective: Physical Exam General: The patient is alert and oriented x3 in no acute distress.  Dermatology: Skin is warm, dry and supple bilateral lower extremities. Negative for open lesions or macerations bilateral.   Vascular: Dorsalis Pedis and Posterior Tibial pulses palpable bilateral.  Capillary fill time is immediate to all digits.  Neurological: Epicritic and protective threshold intact bilateral.   Musculoskeletal: Tenderness to palpation at the medial calcaneal tubercale and through the insertion of the plantar fascia of the bilateral feet. All other joints range of motion within normal limits bilateral. Strength 5/5 in all groups bilateral.   Radiographic exam: Normal osseous mineralization. Joint spaces preserved. No fracture/dislocation/boney destruction. Calcaneal spur present with mild thickening of plantar fascia bilateral. No other soft tissue abnormalities or radiopaque foreign bodies.   Assessment: #1 plantar fasciitis bilateral feet #2 pain in bilateral feet  Plan of Care:  1. Patient evaluated. 2. Today we're not going to provide anti-inflammatory injections but pursue additional conservative treatment 3. Today plantar fascial braces were dispensed for the bilateral feet 4. Prescription for Medrol Dosepak 5. Today we are going to discontinue the meloxicam. Continue Bayer aspirin after completion of the Medrol Dosepak 6. Return to clinic in 4 weeks  Edrick Kins, DPM [facility]  Dr. Edrick Kins,  [address]                                        [place], [place] [address]                Office [phone]  Fax [phone]

## 2017-04-10 ENCOUNTER — Ambulatory Visit: Payer: 59 | Admitting: Podiatry

## 2017-11-21 IMAGING — CT CT HEAD W/O CM
2 series · 15 of 30 positions shown, 17 images · non-contrast
Comparison: None

CLINICAL DATA: Headache with pressure behind the eyes for 1 week.

EXAM:
CT HEAD WITHOUT CONTRAST
TECHNIQUE: Contiguous axial images were obtained from the base of the skull
through the vertex without intravenous contrast.

[Series 2: head without · axial · non-contrast · 0.42mm/px · z∈[+284,+404]mm · 7 of 32 slices shown, 9 images]
[im 4/32  brain]
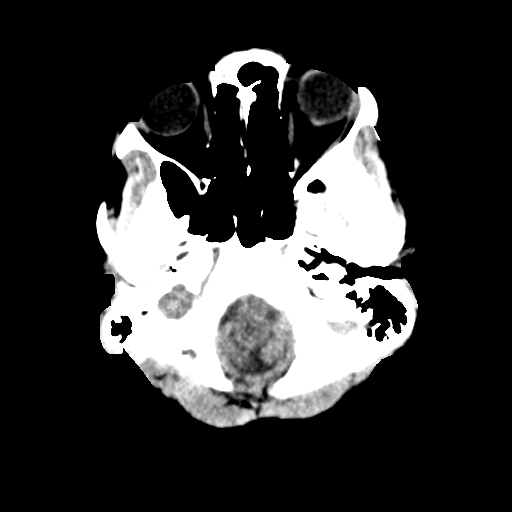
[im 4/32  bone]
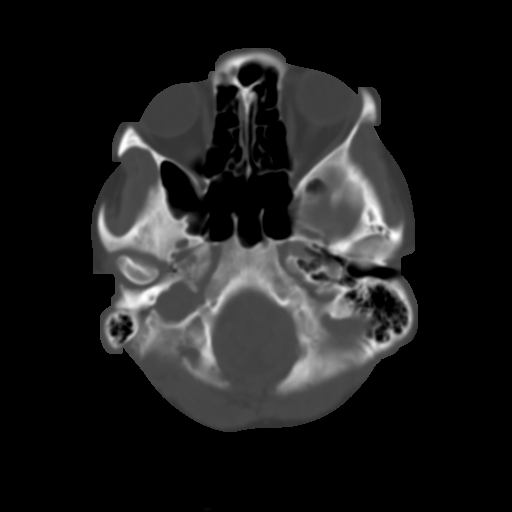
[im 8/32  brain]
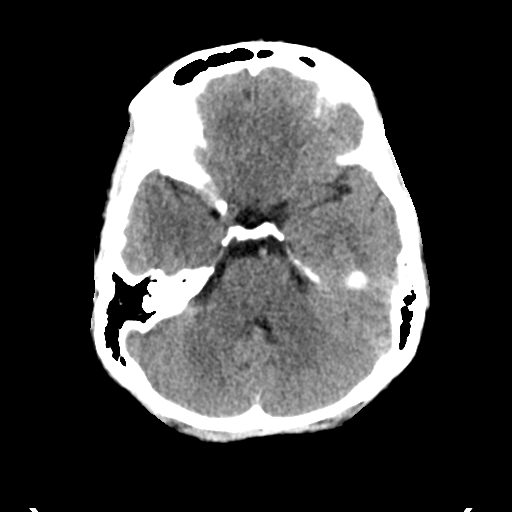
[im 12/32  brain]
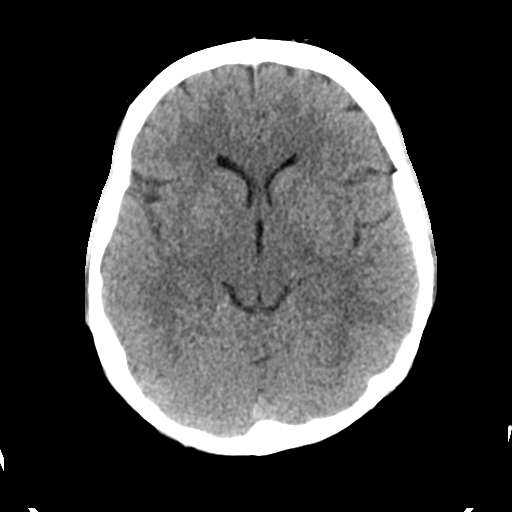
[im 16/32  brain]
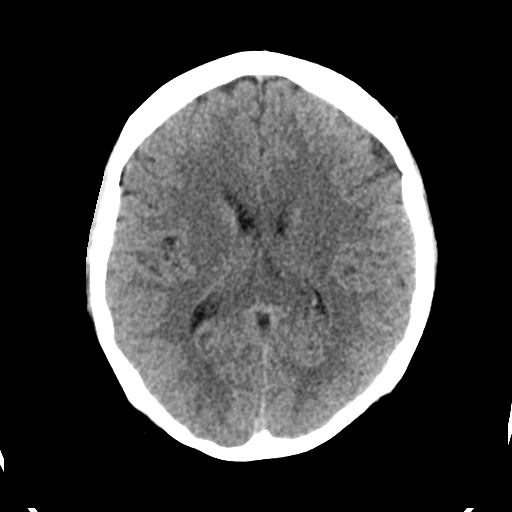
[im 20/32  brain]
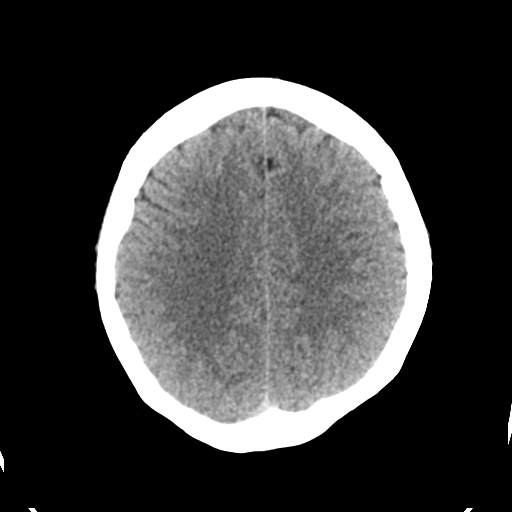
[im 20/32  bone]
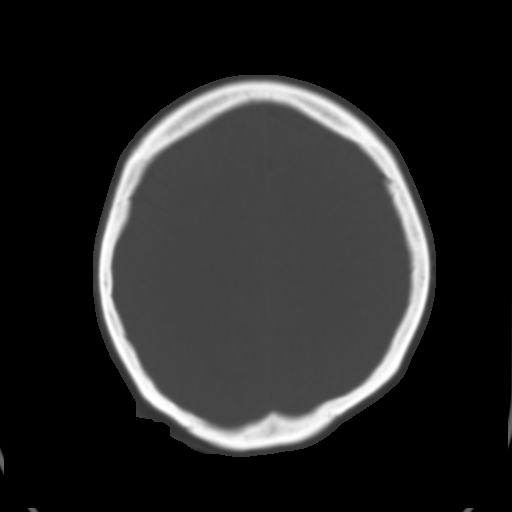
[im 24/32  brain]
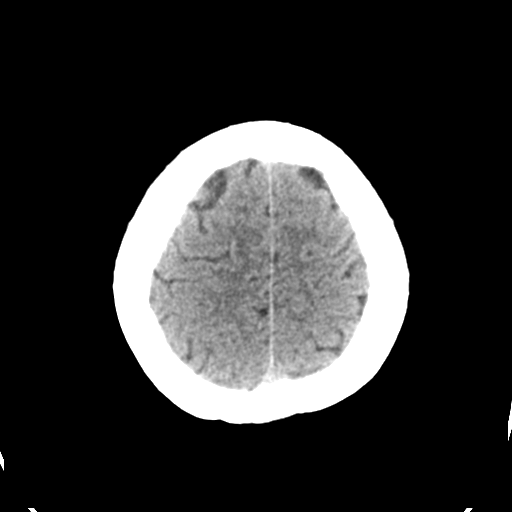
[im 28/32  brain]
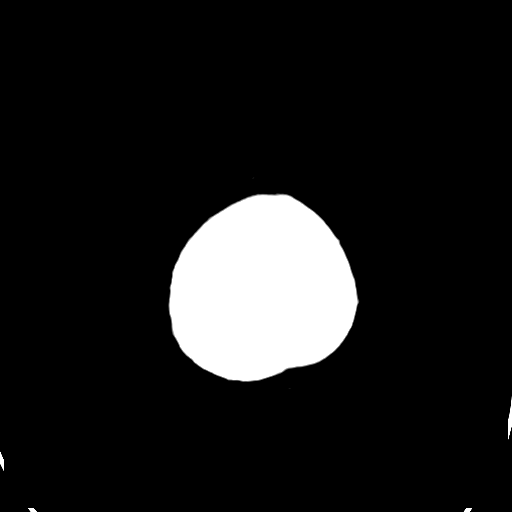

[Series 3: head bone · axial · 0.42mm/px · z∈[+283,+409]mm · 8 of 79 slices shown]
[im 8/79  bone]
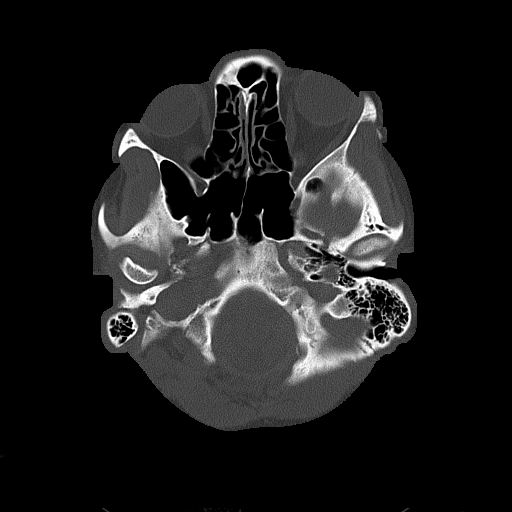
[im 16/79  bone]
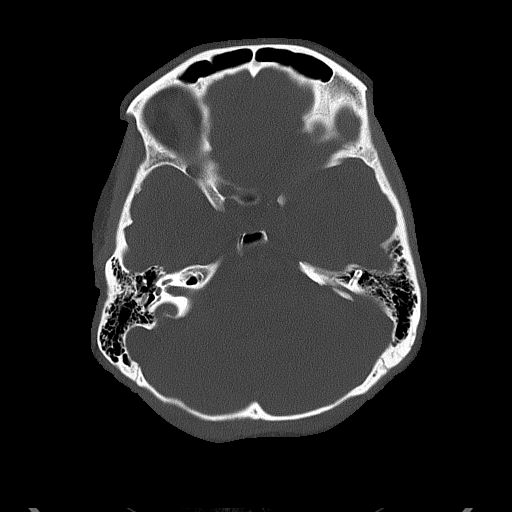
[im 24/79  bone]
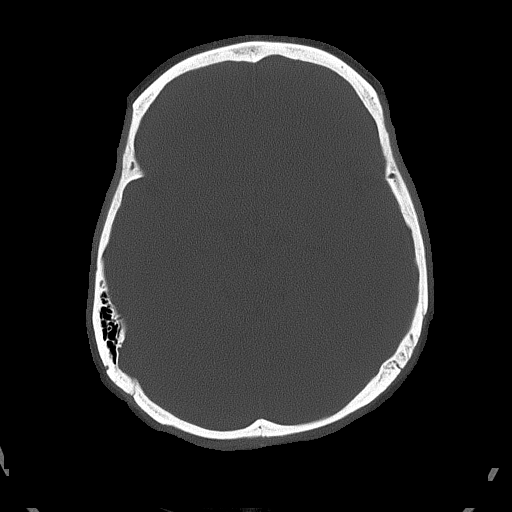
[im 36/79  bone]
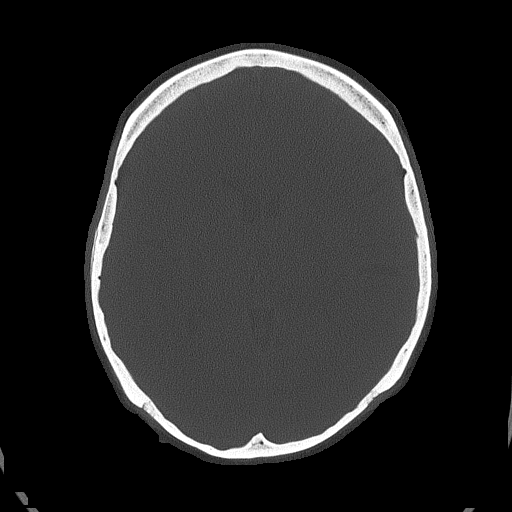
[im 43/79  bone]
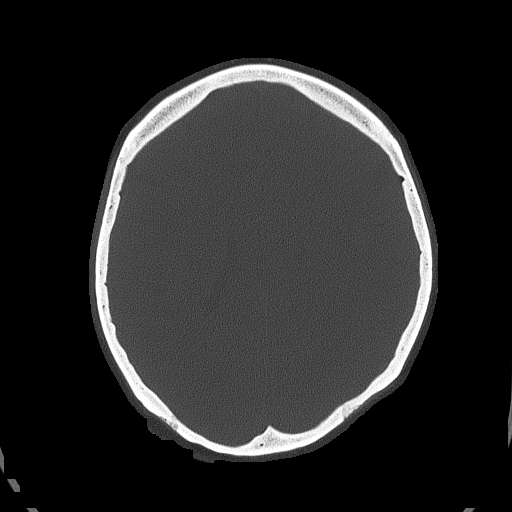
[im 55/79  bone]
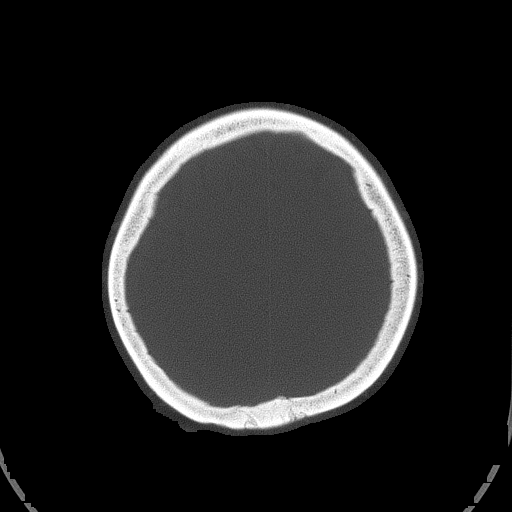
[im 63/79  bone]
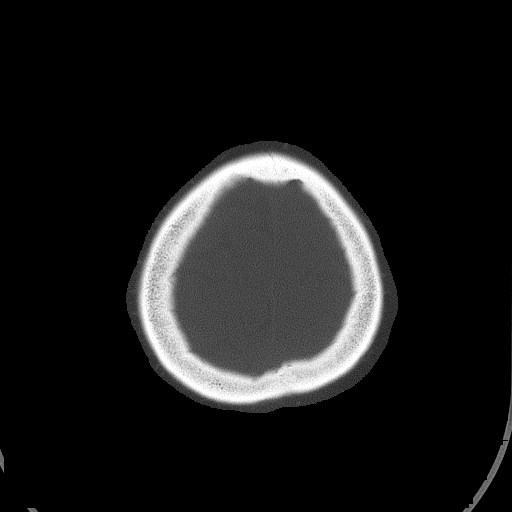
[im 71/79  bone]
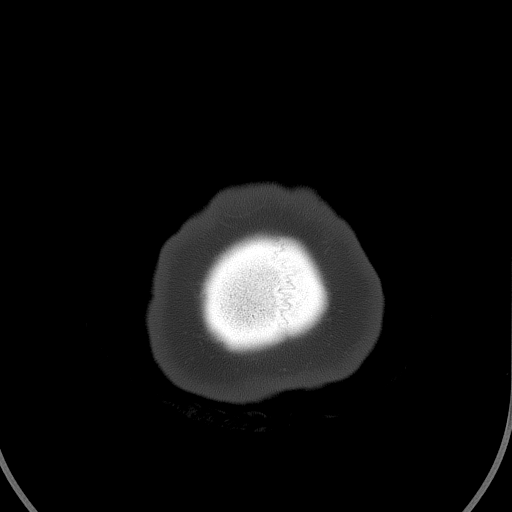

[15 of 30 positions shown; findings below may reference images not displayed]

FINDINGS: Sinuses/Soft tissues: Small mucous retention cysts or polyps in left
ethmoid air cells and right sphenoid sinus. Other paranasal sinuses
and mastoid air cells are clear.

Intracranial: No mass lesion, hemorrhage, hydrocephalus, acute
infarct, intra-axial, or extra-axial fluid collection.
IMPRESSION: No acute intracranial abnormality.

## 2017-12-30 ENCOUNTER — Encounter (HOSPITAL_COMMUNITY): Payer: Self-pay

## 2017-12-30 ENCOUNTER — Other Ambulatory Visit: Payer: Self-pay

## 2017-12-30 ENCOUNTER — Emergency Department (HOSPITAL_COMMUNITY)
Admission: EM | Admit: 2017-12-30 | Discharge: 2017-12-30 | Disposition: A | Discharge status: Home / Self Care | Payer: BLUE CROSS/BLUE SHIELD | Attending: Emergency Medicine | Admitting: Emergency Medicine

## 2017-12-30 DIAGNOSIS — Z79899 Other long term (current) drug therapy: Secondary | ICD-10-CM | POA: Diagnosis not present

## 2017-12-30 DIAGNOSIS — J209 Acute bronchitis, unspecified: Secondary | ICD-10-CM | POA: Diagnosis not present

## 2017-12-30 DIAGNOSIS — R05 Cough: Secondary | ICD-10-CM | POA: Diagnosis present

## 2017-12-30 DIAGNOSIS — J029 Acute pharyngitis, unspecified: Secondary | ICD-10-CM | POA: Diagnosis not present

## 2017-12-30 DIAGNOSIS — Z859 Personal history of malignant neoplasm, unspecified: Secondary | ICD-10-CM | POA: Insufficient documentation

## 2017-12-30 MED ORDER — AZITHROMYCIN 250 MG PO TABS: ORAL_TABLET | ORAL | 0 refills | Status: DC

## 2017-12-30 NOTE — ED Triage Notes (Signed)
Pt arrived from home with c/o sore throat and cough X1 week. Pt also c/o nasal congestion and headache.

## 2017-12-30 NOTE — ED Provider Notes (Signed)
[place] EMERGENCY DEPARTMENT Provider Note   CSN: [id number] Arrival date & time: 12/30/17  1040     History   Chief Complaint Chief Complaint  Patient presents with  . Cough    HPI Hannah Dominguez is a 37 y.o. female.  Patient with history of anemia presents with recurrent productive cough and sore throat for 1 week. Sore throat came first. Congestion as well. No significant sick contacts. Symptoms gradually worsening.     Past Medical History:  Diagnosis Date  . Anemia   . Cancer ([place])     There are no active problems to display for this patient.   Past Surgical History:  Procedure Laterality Date  . CERVICAL CONE BIOPSY    . TUBAL LIGATION       OB History   None      Home Medications    Prior to Admission medications   Medication Sig Start Date End Date Taking? Authorizing Provider  ARIPiprazole (ABILIFY) 10 MG tablet  11/26/16   [provider]  aspirin 325 MG tablet Take 325 mg by mouth every 6 (six) hours as needed for mild pain, moderate pain or headache.    [provider]  azithromycin (ZITHROMAX Z-PAK) 250 MG tablet Take 2 pills day 1 then 1 pill day 2 to 5. 12/30/17   Elnora Morrison, MD  buPROPion (WELLBUTRIN XL) 150 MG 24 hr tablet Take 150 mg by mouth daily.    [provider]  lamoTRIgine (LAMICTAL) 100 MG tablet Take 100 mg by mouth daily.    [provider]  meloxicam (MOBIC) 15 MG tablet Take 1 tablet (15 mg total) by mouth daily. Patient not taking: Reported on 02/13/2017 02/13/17   Edrick Kins, DPM  methylPREDNISolone (MEDROL DOSEPAK) 4 MG TBPK tablet 6 day dose pack - take as directed 03/13/17   Edrick Kins, DPM  prazosin (MINIPRESS) 1 MG capsule  11/26/16   [provider]  promethazine (PHENERGAN) 25 MG tablet Take 1 tablet (25 mg total) by mouth every 6 (six) hours as needed for nausea or vomiting. Patient not taking: Reported on 09/15/2015 12/17/14   Noe Gens, PA-C    risperiDONE (RISPERDAL) 1 MG tablet Take 1 mg by mouth daily.    [provider]  traZODone (DESYREL) 50 MG tablet Take 50-100 mg by mouth at bedtime as needed for sleep.    [provider]    Family History No family history on file.  Social History Social History   Tobacco Use  . Smoking status: Never Smoker  . Smokeless tobacco: Never Used  Substance Use Topics  . Alcohol use: Yes  . Drug use: No     Allergies   Patient has no known allergies.   Review of Systems Review of Systems  Constitutional: Negative for chills and fever.  HENT: Positive for congestion.   Respiratory: Positive for cough. Negative for shortness of breath.   Cardiovascular: Negative for chest pain.  Gastrointestinal: Negative for vomiting.  Genitourinary: Negative for dysuria and flank pain.  Musculoskeletal: Negative for back pain, neck pain and neck stiffness.  Skin: Negative for rash.  Neurological: Negative for light-headedness and headaches.     Physical Exam Updated Vital Signs BP 119/63 (BP Location: Right Arm)   Pulse 63   Temp 99.1 F (37.3 C) (Oral)   Resp (!) 22   Ht 5\' 6"  (1.676 m)   Wt 59.4 kg (131 lb)   SpO2 100%  BMI 21.14 kg/m   Physical Exam  Constitutional: She is oriented to person, place, and time. She appears well-developed and well-nourished.  HENT:  Head: Normocephalic and atraumatic.  No trismus, uvular deviation, unilateral posterior pharyngeal edema or submandibular swelling.   Eyes: Conjunctivae are normal. Right eye exhibits no discharge. Left eye exhibits no discharge.  Neck: Normal range of motion. Neck supple. No tracheal deviation present.  Cardiovascular: Normal rate and regular rhythm.  Pulmonary/Chest: Effort normal and breath sounds normal.  Abdominal: Soft. She exhibits no distension. There is no tenderness. There is no guarding.  Musculoskeletal: She exhibits no edema.  Neurological: She is alert and oriented to person,  place, and time.  Skin: Skin is warm. No rash noted.  Psychiatric: She has a normal mood and affect.  Nursing note and vitals reviewed.    ED Treatments / Results  Labs (all labs ordered are listed, but only abnormal results are displayed) Labs Reviewed - No data to display  EKG None  Radiology No results found.  Procedures Procedures (including critical care time)  Medications Ordered in ED Medications - No data to display   Initial Impression / Assessment and Plan / ED Course  I have reviewed the triage vital signs and the nursing notes.  Pertinent labs & imaging results that were available during my care of the patient were reviewed by me and considered in my medical decision making (see chart for details).    Patient presents with worsening pharyngitis followed by cough. Discussed viral versus bacterial in origin. Patient waiting in the waiting for 5 hours we agreed for trial of azithromycin follow-up with primary doctor.  Results and differential diagnosis were discussed with the patient/parent/guardian. Xrays were independently reviewed by myself.  Close follow up outpatient was discussed, comfortable with the plan.   Medications - No data to display  Vitals:   12/30/17 1115 12/30/17 1117  BP: 119/63   Pulse: 63   Resp: (!) 22   Temp: 99.1 F (37.3 C)   TempSrc: Oral   SpO2: 100%   Weight:  59.4 kg (131 lb)  Height:  5\' 6"  (1.676 m)    Final diagnoses:  Acute pharyngitis, unspecified etiology  Acute bronchitis, unspecified organism     Final Clinical Impressions(s) / ED Diagnoses   Final diagnoses:  Acute pharyngitis, unspecified etiology  Acute bronchitis, unspecified organism    ED Discharge Orders        Ordered    azithromycin (ZITHROMAX Z-PAK) 250 MG tablet     12/30/17 1546       Elnora Morrison, MD 12/30/17 1549

## 2017-12-30 NOTE — ED Notes (Signed)
Patient able to ambulate independently  

## 2017-12-30 NOTE — Discharge Instructions (Signed)
Try tea and honey. Antibiotics for possible strep and atypical pneumonia.  If you were given medicines take as directed.  If you are on coumadin or contraceptives realize their levels and effectiveness is altered by many different medicines.  If you have any reaction (rash, tongues swelling, other) to the medicines stop taking and see a physician.    If your blood pressure was elevated in the ER make sure you follow up for management with a primary doctor or return for chest pain, shortness of breath or stroke symptoms.  Please follow up as directed and return to the ER or see a physician for new or worsening symptoms.  Thank you. Vitals:   12/30/17 1115 12/30/17 1117  BP: 119/63   Pulse: 63   Resp: (!) 22   Temp: 99.1 F (37.3 C)   TempSrc: Oral   SpO2: 100%   Weight:  59.4 kg (131 lb)  Height:  5\' 6"  (1.676 m)

## 2019-01-27 ENCOUNTER — Encounter (HOSPITAL_COMMUNITY): Payer: Self-pay | Admitting: Emergency Medicine

## 2019-01-27 ENCOUNTER — Ambulatory Visit (HOSPITAL_COMMUNITY)
Admission: EM | Admit: 2019-01-27 | Discharge: 2019-01-27 | Disposition: A | Discharge status: Home / Self Care | Payer: 59 | Attending: Physician Assistant | Admitting: Physician Assistant

## 2019-01-27 ENCOUNTER — Other Ambulatory Visit: Payer: Self-pay

## 2019-01-27 DIAGNOSIS — Z3202 Encounter for pregnancy test, result negative: Secondary | ICD-10-CM

## 2019-01-27 DIAGNOSIS — R3915 Urgency of urination: Secondary | ICD-10-CM | POA: Diagnosis present

## 2019-01-27 LAB — POCT URINALYSIS DIP (DEVICE)
Bilirubin Urine: NEGATIVE
Bilirubin Urine: NEGATIVE
Glucose, UA: NEGATIVE mg/dL
Glucose, UA: NEGATIVE mg/dL
Ketones, ur: NEGATIVE mg/dL
Ketones, ur: NEGATIVE mg/dL
Leukocytes,Ua: NEGATIVE
Leukocytes,Ua: NEGATIVE
Nitrite: NEGATIVE
Nitrite: NEGATIVE
Protein, ur: NEGATIVE mg/dL
Protein, ur: NEGATIVE mg/dL
Specific Gravity, Urine: 1.02 (ref 1.005–1.030)
Specific Gravity, Urine: 1.025 (ref 1.005–1.030)
Urobilinogen, UA: 0.2 mg/dL (ref 0.0–1.0)
Urobilinogen, UA: 0.2 mg/dL (ref 0.0–1.0)
pH: 7 (ref 5.0–8.0)
pH: 7 (ref 5.0–8.0)

## 2019-01-27 LAB — POCT PREGNANCY, URINE: Preg Test, Ur: NEGATIVE

## 2019-01-27 MED ORDER — NITROFURANTOIN MONOHYD MACRO 100 MG PO CAPS: 100.0000 mg | ORAL_CAPSULE | Freq: Two times a day (BID) | ORAL | 0 refills | Status: AC

## 2019-01-27 NOTE — ED Triage Notes (Signed)
Per pt she ahs been having urinary urgency and feeling like she has not emptied her bladder for about 2 to 3 weeks. Pt said no blood no burning. Only hurts when she feels full and need to go back to the bathroom.

## 2019-01-27 NOTE — Discharge Instructions (Signed)
Your urine did not show signs of infection today, I will send for a culture to confirm I am going to go ahead and treat you for UTI based off symptoms with macrobid twice daily x 5 days We will send swab off to check for vaginal causes  If your symptoms persisting and testing does not reveal cause please follow up with urology- contact below.

## 2019-01-27 NOTE — ED Provider Notes (Signed)
[place]    CSN: [id number] Arrival date & time: 01/27/19  1108      History   Chief Complaint Chief Complaint  Patient presents with  . Urinary Urgency    HPI Hannah Dominguez is a 38 y.o. female history of tubal ligation presenting today for evaluation of urinary urgency and pressure.  Patient states that over the past 2 to 3 weeks she has had increasing urinary urgency, frequency and pressure sensation along with incomplete voiding.  She has been needing to use the restroom multiple times an hour as well as feeling as if she cannot fully empty her bladder.  Occasionally she will only urinate a small amount despite feeling the urge as if her bladder was full.  She denies any dysuria or hematuria.  Only has history of similar with previous UTI.  Also does endorse history of BV, but does not have any vaginal discharge, irritation or odor.  Denies fever, nausea vomiting or abdominal pain.  When urgency sits and she has increasing frequency to her bladder.  Denies history of tobacco use.  She denies significant incontinence, on occasion will have some overflow/urgency incontinence when she feels her bladder is full.  HPI  Past Medical History:  Diagnosis Date  . Anemia   . Cancer ([place])     There are no active problems to display for this patient.   Past Surgical History:  Procedure Laterality Date  . CERVICAL CONE BIOPSY    . TUBAL LIGATION      OB History   No obstetric history on file.      Home Medications    Prior to Admission medications   Medication Sig Start Date End Date Taking? Authorizing Provider  ARIPiprazole (ABILIFY) 10 MG tablet  11/26/16   [provider]  aspirin 325 MG tablet Take 325 mg by mouth every 6 (six) hours as needed for mild pain, moderate pain or headache.    [provider]  azithromycin (ZITHROMAX Z-PAK) 250 MG tablet Take 2 pills day 1 then 1 pill day 2 to 5. 12/30/17   Elnora Morrison, MD  buPROPion  (WELLBUTRIN XL) 150 MG 24 hr tablet Take 150 mg by mouth daily.    [provider]  lamoTRIgine (LAMICTAL) 100 MG tablet Take 100 mg by mouth daily.    [provider]  meloxicam (MOBIC) 15 MG tablet Take 1 tablet (15 mg total) by mouth daily. Patient not taking: Reported on 02/13/2017 02/13/17   Edrick Kins, DPM  methylPREDNISolone (MEDROL DOSEPAK) 4 MG TBPK tablet 6 day dose pack - take as directed 03/13/17   Edrick Kins, DPM  nitrofurantoin, macrocrystal-monohydrate, (MACROBID) 100 MG capsule Take 1 capsule (100 mg total) by mouth 2 (two) times daily for 5 days. 01/27/19 02/01/19  Muna Demers C, PA-C  prazosin (MINIPRESS) 1 MG capsule  11/26/16   [provider]  promethazine (PHENERGAN) 25 MG tablet Take 1 tablet (25 mg total) by mouth every 6 (six) hours as needed for nausea or vomiting. Patient not taking: Reported on 09/15/2015 12/17/14   Noe Gens, PA-C  risperiDONE (RISPERDAL) 1 MG tablet Take 1 mg by mouth daily.    [provider]  traZODone (DESYREL) 50 MG tablet Take 50-100 mg by mouth at bedtime as needed for sleep.    [provider]    Family History History reviewed. No pertinent family history.  Social History Social History   Tobacco Use  . Smoking status: Never  Smoker  . Smokeless tobacco: Never Used  Substance Use Topics  . Alcohol use: Yes  . Drug use: No     Allergies   Patient has no known allergies.   Review of Systems Review of Systems  Constitutional: Negative for fever.  Respiratory: Negative for shortness of breath.   Cardiovascular: Negative for chest pain.  Gastrointestinal: Negative for abdominal pain, diarrhea, nausea and vomiting.  Genitourinary: Positive for difficulty urinating, dysuria, frequency and urgency. Negative for flank pain, genital sores, hematuria, menstrual problem, vaginal bleeding, vaginal discharge and vaginal pain.  Musculoskeletal: Negative for back pain.  Skin: Negative for  rash.  Neurological: Negative for dizziness, light-headedness and headaches.     Physical Exam Triage Vital Signs ED Triage Vitals [01/27/19 1120]  Enc Vitals Group     BP 126/85     Pulse Rate 66     Resp 16     Temp 98 F (36.7 C)     Temp Source Oral     SpO2 100 %     Weight      Height      Head Circumference      Peak Flow      Pain Score 2     Pain Loc      Pain Edu?      Excl. in University?    No data found.  Updated Vital Signs BP 126/85 (BP Location: Right Arm)   Pulse 66   Temp 98 F (36.7 C) (Oral)   Resp 16   SpO2 100%   Visual Acuity Right Eye Distance:   Left Eye Distance:   Bilateral Distance:    Right Eye Near:   Left Eye Near:    Bilateral Near:     Physical Exam Vitals signs and nursing note reviewed.  Constitutional:      General: She is not in acute distress.    Appearance: She is well-developed.  HENT:     Head: Normocephalic and atraumatic.  Eyes:     Conjunctiva/sclera: Conjunctivae normal.  Neck:     Musculoskeletal: Neck supple.  Cardiovascular:     Rate and Rhythm: Normal rate and regular rhythm.     Heart sounds: No murmur.  Pulmonary:     Effort: Pulmonary effort is normal. No respiratory distress.     Breath sounds: Normal breath sounds.  Abdominal:     Palpations: Abdomen is soft.     Tenderness: There is abdominal tenderness.     Comments: Tenderness to suprapubic area bilaterally, negative rebound, negative Rovsing  Skin:    General: Skin is warm and dry.  Neurological:     Mental Status: She is alert.      UC Treatments / Results  Labs (all labs ordered are listed, but only abnormal results are displayed) Labs Reviewed  POCT URINALYSIS DIP (DEVICE) - Abnormal; Notable for the following components:      Result Value   Hgb urine dipstick TRACE (*)    All other components within normal limits  POCT URINALYSIS DIP (DEVICE) - Abnormal; Notable for the following components:   Hgb urine dipstick TRACE (*)    All  other components within normal limits  URINE CULTURE  POC URINE PREG, ED  POCT PREGNANCY, URINE  CERVICOVAGINAL ANCILLARY ONLY    EKG None  Radiology No results found.  Procedures Procedures (including critical care time)  Medications Ordered in UC Medications - No data to display  Initial Impression / Assessment and Plan / UC  Course  I have reviewed the triage vital signs and the nursing notes.  Pertinent labs & imaging results that were available during my care of the patient were reviewed by me and considered in my medical decision making (see chart for details).     Trace hemoglobin on UA.  Negative leuks and nitrites.  Discussed with patient that UA today not suggestive of UTI.  Will send for culture to confirm.  Advised to explore vaginal causes of symptoms, swab obtained.  Will send off and will call with results and alter treatment as needed.  Did opt to go ahead and treat based off clinical symptoms for UTI with Macrobid twice daily x5 days.  Advised if culture returns negative and swab negative to follow-up with urology for further evaluation.Discussed strict return precautions. Patient verbalized understanding and is agreeable with plan.  Final Clinical Impressions(s) / UC Diagnoses   Final diagnoses:  Urinary urgency     Discharge Instructions     Your urine did not show signs of infection today, I will send for a culture to confirm I am going to go ahead and treat you for UTI based off symptoms with macrobid twice daily x 5 days We will send swab off to check for vaginal causes  If your symptoms persisting and testing does not reveal cause please follow up with urology- contact below.   ED Prescriptions    Medication Sig Dispense Auth. Provider   nitrofurantoin, macrocrystal-monohydrate, (MACROBID) 100 MG capsule Take 1 capsule (100 mg total) by mouth 2 (two) times daily for 5 days. 10 capsule Adline Kirshenbaum C, PA-C     Controlled Substance Prescriptions  [place] Controlled Substance Registry consulted? Not Applicable   Janith Lima, [place] 01/27/19 1221

## 2019-01-28 LAB — URINE CULTURE: Culture: NO GROWTH

## 2019-01-28 LAB — CERVICOVAGINAL ANCILLARY ONLY
Bacterial vaginitis: POSITIVE — AB
Candida vaginitis: POSITIVE — AB
Chlamydia: NEGATIVE
Neisseria Gonorrhea: NEGATIVE
Trichomonas: NEGATIVE

## 2019-01-29 ENCOUNTER — Telehealth (HOSPITAL_COMMUNITY): Payer: Self-pay | Admitting: Emergency Medicine

## 2019-01-29 MED ORDER — FLUCONAZOLE 150 MG PO TABS: 150.0000 mg | ORAL_TABLET | Freq: Once | ORAL | 0 refills | Status: AC

## 2019-01-29 MED ORDER — METRONIDAZOLE 500 MG PO TABS: 500.0000 mg | ORAL_TABLET | Freq: Two times a day (BID) | ORAL | 0 refills | Status: DC

## 2019-01-29 NOTE — Telephone Encounter (Signed)
Positive for BV and Yeast. Flagyl and diflucan sent. Patient answered and verablized uderstanding. Answered questions.

## 2019-07-04 ENCOUNTER — Encounter: Payer: Self-pay | Admitting: Gastroenterology

## 2019-07-08 ENCOUNTER — Ambulatory Visit: Payer: 59 | Admitting: Gastroenterology

## 2019-07-08 ENCOUNTER — Other Ambulatory Visit: Payer: Self-pay

## 2019-07-08 VITALS — BP 100/60 | HR 102 | Temp 98.6°F | Ht 66.0 in | Wt 147.0 lb

## 2019-07-08 DIAGNOSIS — R1032 Left lower quadrant pain: Secondary | ICD-10-CM | POA: Diagnosis not present

## 2019-07-08 DIAGNOSIS — Z1159 Encounter for screening for other viral diseases: Secondary | ICD-10-CM

## 2019-07-08 DIAGNOSIS — R195 Other fecal abnormalities: Secondary | ICD-10-CM | POA: Diagnosis not present

## 2019-07-08 NOTE — Progress Notes (Signed)
Referring Provider: Shon Baton, MD Primary Care Physician:  Shon Baton, MD  Reason for Consultation:  LLQ pain   IMPRESSION:  LLQ pain Change in stool caliber  The differential for left lower abdominal pain and change in stool caliber includes functional abdominal pain, irritable bowel syndrome, inflammatory bowel disease, spastic bowel disorders NOS, chronic constipation, segmental colitis associated with diverticulosis. Polyp, mass, or anatomic obstruction must also be considered.    PLAN: Trial of Align recommended Colonoscopy CT abd/pelvis if colonoscopy is nondiagnostic  The nature of the procedure, as well as the risks, benefits, and alternatives were carefully and thoroughly reviewed with the patient. Ample time for discussion and questions allowed. The patient understood, was satisfied, and agreed to proceed.  Please see the "Patient Instructions" section for addition details about the plan.  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 Virgina Jock for further evaluation of left-sided abdominal pain.  The history is obtained through the patient and review of records provided by Dr. Virgina Jock.  She has bipolar, anemia, hypoglycemia, headaches, and chronic back pain.  Left-sided abdominal pain from her ribs down into her pelvis x several months. Present when she wakes up.  Unable to breath deeply until she has a bowel movement due to the pain.  Pain improves with defecation. Stool is now like "soft serve" ice cream.  Associated increase in eructation and flatus.  Denies constipation or diarrhea. Has a bowel movement daily, sometimes every more. Poor appetite since having her teeth pulled 3 weeks ago. Following a softer diet since then.  Tried laxatives.  Dr. Virgina Jock recommended fiber, fluids, and probiotics but she has not picked them up yet. Initially concerned that it might be a blockage, but then realized this this was less likely.   GYN  evaluation revealed no cause for her symptoms.   Labs 2011 after the birth of her son: Hemoglobin 10.8 Labs 12/2014: Hemoglobin 12.5 Labs 06/27/2019 show a normal urinalysis, normal comprehensive metabolic panel.  WBC 6.79, hemoglobin 12.5, platelets 292, MCV 84.1, RDW 12.1.  No known family history of colon cancer or polyps. No family history of uterine/endometrial cancer, pancreatic cancer or gastric/stomach cancer.   Past Medical History:  Diagnosis Date  . Anemia   . Bipolar 1 disorder ([place])   . Cancer ([place])   . Chronic headaches   . Hypoglycemia     Past Surgical History:  Procedure Laterality Date  . CERVICAL CONE BIOPSY    . TUBAL LIGATION      Current Outpatient Medications  Medication Sig Dispense Refill  . ARIPiprazole (ABILIFY) 10 MG tablet     . aspirin 325 MG tablet Take 325 mg by mouth every 6 (six) hours as needed for mild pain, moderate pain or headache.    Marland Kitchen azithromycin (ZITHROMAX Z-PAK) 250 MG tablet Take 2 pills day 1 then 1 pill day 2 to 5. 6 tablet 0  . buPROPion (WELLBUTRIN XL) 150 MG 24 hr tablet Take 150 mg by mouth daily.    Marland Kitchen lamoTRIgine (LAMICTAL) 100 MG tablet Take 100 mg by mouth daily.    . meloxicam (MOBIC) 15 MG tablet Take 1 tablet (15 mg total) by mouth daily. (Patient not taking: Reported on 02/13/2017) 30 tablet 1  . methylPREDNISolone (MEDROL DOSEPAK) 4 MG TBPK tablet 6 day dose pack - take as directed 21 tablet 0  . metroNIDAZOLE (FLAGYL) 500 MG tablet Take 1 tablet (500 mg total) by mouth 2 (two) times  daily. 14 tablet 0  . prazosin (MINIPRESS) 1 MG capsule     . promethazine (PHENERGAN) 25 MG tablet Take 1 tablet (25 mg total) by mouth every 6 (six) hours as needed for nausea or vomiting. (Patient not taking: Reported on 09/15/2015) 10 tablet 0  . risperiDONE (RISPERDAL) 1 MG tablet Take 1 mg by mouth daily.    . traZODone (DESYREL) 50 MG tablet Take 50-100 mg by mouth at bedtime as needed for sleep.     Current Facility-Administered  Medications  Medication Dose Route Frequency Provider Last Rate Last Dose  . betamethasone acetate-betamethasone sodium phosphate (CELESTONE) injection 3 mg  3 mg Intramuscular Once Edrick Kins, DPM        Allergies as of 07/08/2019  . (No Known Allergies)    No family history on file.  Social History   Socioeconomic History  . Marital status: Married    Spouse name: Not on file  . Number of children: Not on file  . Years of education: Not on file  . Highest education level: Not on file  Occupational History  . Not on file  Social Needs  . Financial resource strain: Not on file  . Food insecurity    Worry: Not on file    Inability: Not on file  . Transportation needs    Medical: Not on file    Non-medical: Not on file  Tobacco Use  . Smoking status: Never Smoker  . Smokeless tobacco: Never Used  Substance and Sexual Activity  . Alcohol use: Yes  . Drug use: No  . Sexual activity: Yes    Birth control/protection: None  Lifestyle  . Physical activity    Days per week: Not on file    Minutes per session: Not on file  . Stress: Not on file  Relationships  . Social Herbalist on phone: Not on file    Gets together: Not on file    Attends religious service: Not on file    Active member of club or organization: Not on file    Attends meetings of clubs or organizations: Not on file    Relationship status: Not on file  . Intimate partner violence    Fear of current or ex partner: Not on file    Emotionally abused: Not on file    Physically abused: Not on file    Forced sexual activity: Not on file  Other Topics Concern  . Not on file  Social History Narrative  . Not on file    Review of Systems: 12 system ROS is negative except as noted above with the addition of back pain, headaches, menstrual cramps, insomnia, excessive urination, and urine leakage.Marland Kitchen   Physical Exam: General:   Alert,  well-nourished, pleasant and cooperative in NAD Head:   Normocephalic and atraumatic. Eyes:  Sclera clear, no icterus.   Conjunctiva pink. Ears:  Normal auditory acuity. Nose:  No deformity, discharge,  or lesions. Mouth:  No deformity or lesions.   Neck:  Supple; no masses or thyromegaly. Lungs:  Clear throughout to auscultation.   No wheezes. Heart:  Regular rate and rhythm; no murmurs. Abdomen:  Soft, nontender, nondistended, normal bowel sounds, no rebound or guarding. No hepatosplenomegaly.  I am unable to reproduce her abdominal pain. Rectal:  Deferred  Msk:  Symmetrical. No boney deformities LAD: No inguinal or umbilical LAD Extremities:  No clubbing or edema. Neurologic:  Alert and  oriented x4;  grossly nonfocal Skin:  Intact without significant lesions or rashes. Psych:  Alert and cooperative. Normal mood and affect.      Aariyah Sampey L. Tarri Glenn, MD, MPH 07/08/2019, 2:29 PM

## 2019-07-08 NOTE — Patient Instructions (Addendum)
You have been scheduled for a colonoscopy. Please follow written instructions given to you at your visit today.  Please pick up your prep supplies at the pharmacy within the next 1-3 days. If you use inhalers (even only as needed), please bring them with you on the day of your procedure. Your physician has requested that you go to www.startemmi.com and enter the access code given to you at your visit today. This web site gives a general overview about your procedure. However, you should still follow specific instructions given to you by our office regarding your preparation for the procedure. ____________________________________________________________  It was a pleasure to meet you today.   I have recommended a colonoscopy to further evaluation your abdominal pain and change in bowel habits. If that doesn't give [place] an explanation for your symptoms, will proceed with a CT scan.  Tips for colonoscopy:  - Stay well hydrated for 3-4 days prior to the exam. This reduces nausea and dehydration.  - To prevent skin/hemorrhoid irritation - prior to wiping, put A&Dointment or vaseline on the toilet paper. - Keep a towel or pad on the bed.  - Drink  64oz of clear liquids in the morning of prep day (prior to starting the prep) to be sure that there is enough fluid to flush the colon and stay hydrated!!!! This is in addition to the fluids required for preparation. - Use of a flavored hard candy, such as grape Anise Salvo, can counteract some of the flavor of the prep and may prevent some nausea.   _________________________________________________________ If you are age 31 or older, your body mass index should be between 23-30. Your Body mass index is 23.73 kg/m. If this is out of the aforementioned range listed, please consider follow up with your Primary Care Provider.  If you are age 15 or younger, your body mass index should be between 19-25. Your Body mass index is 23.73 kg/m. If this is out of the  aformentioned range listed, please consider follow up with your Primary Care Provider.

## 2019-08-02 ENCOUNTER — Ambulatory Visit (INDEPENDENT_AMBULATORY_CARE_PROVIDER_SITE_OTHER): Payer: 59

## 2019-08-02 ENCOUNTER — Other Ambulatory Visit: Payer: Self-pay | Admitting: Gastroenterology

## 2019-08-02 ENCOUNTER — Encounter: Payer: Self-pay | Admitting: Gastroenterology

## 2019-08-02 DIAGNOSIS — Z1159 Encounter for screening for other viral diseases: Secondary | ICD-10-CM

## 2019-08-05 LAB — SARS CORONAVIRUS 2 (TAT 6-24 HRS): SARS Coronavirus 2: NEGATIVE

## 2019-08-07 ENCOUNTER — Other Ambulatory Visit: Payer: Self-pay

## 2019-08-07 ENCOUNTER — Encounter: Payer: 59 | Admitting: Gastroenterology

## 2019-08-07 ENCOUNTER — Ambulatory Visit (AMBULATORY_SURGERY_CENTER): Payer: 59 | Admitting: Internal Medicine

## 2019-08-07 ENCOUNTER — Encounter: Payer: Self-pay | Admitting: Internal Medicine

## 2019-08-07 VITALS — BP 106/62 | HR 51 | Temp 98.7°F | Resp 10 | Ht 66.0 in | Wt 147.0 lb

## 2019-08-07 DIAGNOSIS — K5730 Diverticulosis of large intestine without perforation or abscess without bleeding: Secondary | ICD-10-CM | POA: Diagnosis present

## 2019-08-07 DIAGNOSIS — R195 Other fecal abnormalities: Secondary | ICD-10-CM

## 2019-08-07 DIAGNOSIS — R1032 Left lower quadrant pain: Secondary | ICD-10-CM

## 2019-08-07 MED ORDER — SODIUM CHLORIDE 0.9 % IV SOLN: 500.0000 mL | Freq: Once | INTRAVENOUS | Status: DC

## 2019-08-07 NOTE — Op Note (Addendum)
[facility] Patient Name: Hannah Dominguez Procedure Date: 08/07/2019 8:41 AM MRN: [id number] Endoscopist: Gatha Mayer , MD Age: 38 Referring MD:  Date of Birth: 25-Apr-1981 Gender: Female Account #: [id number] Procedure:                Colonoscopy Indications:              Abdominal pain in the left lower quadrant, Change                            in stool caliber Medicines:                Propofol per Anesthesia, Monitored Anesthesia Care Procedure:                Pre-Anesthesia Assessment:                           - Prior to the procedure, a History and Physical                            was performed, and patient medications and                            allergies were reviewed. The patient's tolerance of                            previous anesthesia was also reviewed. The risks                            and benefits of the procedure and the sedation                            options and risks were discussed with the patient.                            All questions were answered, and informed consent                            was obtained. Prior Anticoagulants: The patient has                            taken no previous anticoagulant or antiplatelet                            agents. ASA Grade Assessment: II - A patient with                            mild systemic disease. After reviewing the risks                            and benefits, the patient was deemed in                            satisfactory condition to undergo the procedure.  After obtaining informed consent, the colonoscope                            was passed under direct vision. Throughout the                            procedure, the patient's blood pressure, pulse, and                            oxygen saturations were monitored continuously. The                            Colonoscope was introduced through the anus and                            advanced to the  the terminal ileum, with                            identification of the appendiceal orifice and IC                            valve. The colonoscopy was performed without                            difficulty. The patient tolerated the procedure                            well. The quality of the bowel preparation was                            excellent. The terminal ileum, ileocecal valve,                            appendiceal orifice, and rectum were photographed.                            The bowel preparation used was Miralax via split                            dose instruction. Scope In: 9:01:57 AM Scope Out: 9:11:31 AM Scope Withdrawal Time: 0 hours 7 minutes 43 seconds  Total Procedure Duration: 0 hours 9 minutes 34 seconds  Findings:                 The perianal and digital rectal examinations were                            normal.                           Multiple small-mouthed diverticula were found in                            the sigmoid colon. There was narrowing of the colon  in association with the diverticular opening.                           A single medium-mouthed diverticulum was found in                            the ascending colon. There was no evidence of                            diverticular bleeding.                           The exam was otherwise without abnormality on                            direct and retroflexion views. Complications:            No immediate complications. Estimated Blood Loss:     Estimated blood loss: none. Impression:               - Moderate diverticulosis in the sigmoid colon.                            There was narrowing of the colon in association                            with the diverticular opening.                           - Mild diverticulosis in the ascending colon. There                            was no evidence of diverticular bleeding.                           - The  examination was otherwise normal on direct                            and retroflexion views.                           - No specimens collected. Recommendation:           - Patient has a contact number available for                            emergencies. The signs and symptoms of potential                            delayed complications were discussed with the                            patient. Return to normal activities tomorrow.                            Written discharge instructions were provided to the  patient.                           - High fiber diet.                           - Repeat colonoscopy in 10 years for screening                            purposes. DR Tarri Glenn                           - I think the findings of sigmoid diverticulosis                            can explain her symptoms and have suggest high                            biber diet and possibly fiber supplement.                           may also try IB Gard.                           Hold on CT scanning - will send to dr. Tarri Glenn for                            review as she is primary GI MD Gatha Mayer, MD 08/07/2019 9:23:15 AM This report has been signed electronically.

## 2019-08-07 NOTE — Progress Notes (Signed)
Report to PACU, RN, vss, BBS= Clear.  

## 2019-08-07 NOTE — Progress Notes (Signed)
Temp taken by JB VS taken by CW 

## 2019-08-07 NOTE — Patient Instructions (Addendum)
I found diverticulosis -  Please see the handout but thickened muscular folds and little pockets in the colon. Yours is in the sigmoid colon and I think responsible for your symptoms.  I do not think you need a CT scan based upon this - would try increased fiber and maybe even a fiber supplement like metamucil 1-3 teaspoons in evening.  Also may try IB Gard 2 before meals and/or at bedtime  If this fails to relieve things then maybe the CT scan.  I will let Dr. Tarri Glenn know also and see if she agrees since she knows you better.  I appreciate the opportunity to care for you. Gatha Mayer, MD, FACG  YOU HAD AN ENDOSCOPIC PROCEDURE TODAY AT [facility]:   Refer to the procedure report that was given to you for any specific questions about what was found during the examination.  If the procedure report does not answer your questions, please call your gastroenterologist to clarify.  If you requested that your care partner not be given the details of your procedure findings, then the procedure report has been included in a sealed envelope for you to review at your convenience later.  YOU SHOULD EXPECT: Some feelings of bloating in the abdomen. Passage of more gas than usual.  Walking can help get rid of the air that was put into your GI tract during the procedure and reduce the bloating. If you had a lower endoscopy (such as a colonoscopy or flexible sigmoidoscopy) you may notice spotting of blood in your stool or on the toilet paper. If you underwent a bowel prep for your procedure, you may not have a normal bowel movement for a few days.  Please Note:  You might notice some irritation and congestion in your nose or some drainage.  This is from the oxygen used during your procedure.  There is no need for concern and it should clear up in a day or so.  SYMPTOMS TO REPORT IMMEDIATELY:   Following lower endoscopy (colonoscopy or flexible sigmoidoscopy):  Excessive amounts of blood  in the stool  Significant tenderness or worsening of abdominal pains  Swelling of the abdomen that is new, acute  Fever of 100F or higher  For urgent or emergent issues, a gastroenterologist can be reached at any hour by calling [phone].   DIET:  We do recommend a small meal at first, but then you may proceed to your regular diet.  Drink plenty of fluids but you should avoid alcoholic beverages for 24 hours.  ACTIVITY:  You should plan to take it easy for the rest of today and you should NOT DRIVE or use heavy machinery until tomorrow (because of the sedation medicines used during the test).    FOLLOW UP: Our staff will call the number listed on your records 48-72 hours following your procedure to check on you and address any questions or concerns that you may have regarding the information given to you following your procedure. If we do not reach you, we will leave a message.  We will attempt to reach you two times.  During this call, we will ask if you have developed any symptoms of COVID 19. If you develop any symptoms (ie: fever, flu-like symptoms, shortness of breath, cough etc.) before then, please call [phone].  If you test positive for Covid 19 in the 2 weeks post procedure, please call and report this information to [place].    If any biopsies were taken you  will be contacted by phone or by letter within the next 1-3 weeks.  Please call us at [phone] if you have not heard about the biopsies in 3 weeks.    SIGNATURES/CONFIDENTIALITY: You and/or your care partner have signed paperwork which will be entered into your electronic medical record.  These signatures attest to the fact that that the information above on your After Visit Summary has been reviewed and is understood.  Full responsibility of the confidentiality of this discharge information lies with you and/or your care-partner.  Await pathology  Continue your normal medications  Dr. Carlean Purl will make sure Dr.  Tarri Glenn is aware of the finding of procedure today  Next colonoscopy in 10 years

## 2019-08-12 ENCOUNTER — Telehealth: Payer: Self-pay | Admitting: *Deleted

## 2019-08-12 NOTE — Telephone Encounter (Signed)
1. Have you developed a fever since your procedure? no  2.   Have you had an respiratory symptoms (SOB or cough) since your procedure? no  3.   Have you tested positive for COVID 19 since your procedure no  4.   Have you had any family members/close contacts diagnosed with the COVID 19 since your procedure?  no   If yes to any of these questions please route to Joylene John, RN and Alphonsa Gin, Therapist, sports.  Follow up Call-  Call back number 08/07/2019  Post procedure Call Back phone  # [phone]  Permission to leave phone message Yes  Some recent data might be hidden     Patient questions:  Do you have a fever, pain , or abdominal swelling? No. Pain Score  0 *  Have you tolerated food without any problems? Yes.    Have you been able to return to your normal activities? Yes.    Do you have any questions about your discharge instructions: Diet   No. Medications  No. Follow up visit  No.  Do you have questions or concerns about your Care? No.  Actions: * If pain score is 4 or above: No action needed, pain <4.

## 2019-08-23 ENCOUNTER — Other Ambulatory Visit: Payer: Self-pay | Admitting: Obstetrics and Gynecology
